# Patient Record
Sex: Female | Born: 1975 | Race: Asian | Hispanic: No | Marital: Married | Smoking: Never smoker
Health system: Southern US, Community
[De-identification: ages and names within clinical notes are randomized; demographics above are authoritative.]

## PROBLEM LIST (undated history)

## (undated) DIAGNOSIS — E785 Hyperlipidemia, unspecified: Secondary | ICD-10-CM

## (undated) DIAGNOSIS — G5600 Carpal tunnel syndrome, unspecified upper limb: Secondary | ICD-10-CM

## (undated) DIAGNOSIS — T7840XA Allergy, unspecified, initial encounter: Secondary | ICD-10-CM

## (undated) HISTORY — DX: Allergy, unspecified, initial encounter: T78.40XA

## (undated) HISTORY — DX: Hyperlipidemia, unspecified: E78.5

## (undated) HISTORY — DX: Carpal tunnel syndrome, unspecified upper limb: G56.00

---

## 2002-05-15 ENCOUNTER — Inpatient Hospital Stay (HOSPITAL_COMMUNITY): Admission: AD | Admit: 2002-05-15 | Discharge: 2002-05-18 | Payer: Self-pay | Admitting: Gynecology

## 2002-06-21 ENCOUNTER — Other Ambulatory Visit: Admission: RE | Admit: 2002-06-21 | Discharge: 2002-06-21 | Payer: Self-pay | Admitting: Gynecology

## 2005-10-28 ENCOUNTER — Other Ambulatory Visit: Admission: RE | Admit: 2005-10-28 | Discharge: 2005-10-28 | Payer: Self-pay | Admitting: *Deleted

## 2013-12-11 ENCOUNTER — Ambulatory Visit: Payer: Self-pay | Admitting: Family Medicine

## 2013-12-16 ENCOUNTER — Ambulatory Visit (INDEPENDENT_AMBULATORY_CARE_PROVIDER_SITE_OTHER): Payer: Managed Care, Other (non HMO) | Admitting: Family Medicine

## 2013-12-16 ENCOUNTER — Encounter: Payer: Self-pay | Admitting: Family Medicine

## 2013-12-16 VITALS — BP 120/86 | HR 91 | Temp 98.2°F | Resp 16 | Ht <= 58 in | Wt 122.1 lb

## 2013-12-16 DIAGNOSIS — Z0000 Encounter for general adult medical examination without abnormal findings: Secondary | ICD-10-CM

## 2013-12-16 LAB — CBC WITH DIFFERENTIAL/PLATELET
Basophils Absolute: 0 10*3/uL (ref 0.0–0.1)
Basophils Relative: 0.4 % (ref 0.0–3.0)
EOS PCT: 1.8 % (ref 0.0–5.0)
Eosinophils Absolute: 0.1 10*3/uL (ref 0.0–0.7)
HEMATOCRIT: 32.9 % — AB (ref 36.0–46.0)
Hemoglobin: 10.4 g/dL — ABNORMAL LOW (ref 12.0–15.0)
LYMPHS ABS: 2.1 10*3/uL (ref 0.7–4.0)
Lymphocytes Relative: 33.7 % (ref 12.0–46.0)
MCHC: 31.7 g/dL (ref 30.0–36.0)
MCV: 78.9 fl (ref 78.0–100.0)
Monocytes Absolute: 0.4 10*3/uL (ref 0.1–1.0)
Monocytes Relative: 5.9 % (ref 3.0–12.0)
NEUTROS ABS: 3.6 10*3/uL (ref 1.4–7.7)
Neutrophils Relative %: 58.2 % (ref 43.0–77.0)
Platelets: 278 10*3/uL (ref 150.0–400.0)
RBC: 4.17 Mil/uL (ref 3.87–5.11)
RDW: 16.9 % — ABNORMAL HIGH (ref 11.5–15.5)
WBC: 6.2 10*3/uL (ref 4.0–10.5)

## 2013-12-16 LAB — LIPID PANEL
CHOL/HDL RATIO: 4
Cholesterol: 248 mg/dL — ABNORMAL HIGH (ref 0–200)
HDL: 61.2 mg/dL (ref >39.00)
LDL Cholesterol: 151 mg/dL — ABNORMAL HIGH (ref 0–99)
NONHDL: 186.8
Triglycerides: 179 mg/dL — ABNORMAL HIGH (ref 0.0–149.0)
VLDL: 35.8 mg/dL (ref 0.0–40.0)

## 2013-12-16 LAB — BASIC METABOLIC PANEL
BUN: 12 mg/dL (ref 6–23)
CALCIUM: 8.4 mg/dL (ref 8.4–10.5)
CHLORIDE: 104 meq/L (ref 96–112)
CO2: 21 mEq/L (ref 19–32)
CREATININE: 0.5 mg/dL (ref 0.4–1.2)
GFR: 134.35 mL/min (ref >60.00)
Glucose, Bld: 92 mg/dL (ref 70–99)
Potassium: 3.4 mEq/L — ABNORMAL LOW (ref 3.5–5.1)
Sodium: 134 mEq/L — ABNORMAL LOW (ref 135–145)

## 2013-12-16 LAB — VITAMIN D 25 HYDROXY (VIT D DEFICIENCY, FRACTURES): VITD: 12.65 ng/mL — ABNORMAL LOW (ref 30.00–100.00)

## 2013-12-16 LAB — HEPATIC FUNCTION PANEL
ALT: 10 U/L (ref 0–35)
AST: 16 U/L (ref 0–37)
Albumin: 4.2 g/dL (ref 3.5–5.2)
Alkaline Phosphatase: 46 U/L (ref 39–117)
BILIRUBIN DIRECT: 0 mg/dL (ref 0.0–0.3)
BILIRUBIN TOTAL: 0.3 mg/dL (ref 0.2–1.2)
Total Protein: 7.8 g/dL (ref 6.0–8.3)

## 2013-12-16 LAB — TSH: TSH: 1.63 u[IU]/mL (ref 0.35–4.50)

## 2013-12-16 NOTE — Progress Notes (Signed)
Pre visit review using our clinic review tool, if applicable. No additional management support is needed unless otherwise documented below in the visit note. 

## 2013-12-16 NOTE — Assessment & Plan Note (Signed)
Pt's PE WNL.  Due for pap- pt prefers to schedule this at a later date.  Due for Tdap- pt doesn't want this today either.  Check labs.  Anticipatory guidance provided.

## 2013-12-16 NOTE — Progress Notes (Signed)
   Subjective:    Patient ID: Gloria Buckley, female    DOB: 07/08/1975, 38 y.o.   MRN: [id number]  HPI New to establish.  No recent MD.  No recent pap.   Review of Systems Patient reports no vision/ hearing changes, adenopathy,fever, weight change,  persistant/recurrent hoarseness , swallowing issues, chest pain, palpitations, edema, persistant/recurrent cough, hemoptysis, dyspnea (rest/exertional/paroxysmal nocturnal), gastrointestinal bleeding (melena, rectal bleeding), abdominal pain, significant heartburn, GU symptoms (dysuria, hematuria, incontinence), Gyn symptoms (abnormal  bleeding, pain),  syncope, focal weakness, memory loss, numbness & tingling, skin/hair/nail changes, abnormal bruising or bleeding, anxiety, or depression.  + chronic constipation     Objective:   Physical Exam General Appearance:    Alert, cooperative, no distress, appears stated age  Head:    Normocephalic, without obvious abnormality, atraumatic  Eyes:    PERRL, conjunctiva/corneas clear, EOM's intact, fundi    benign, both eyes  Ears:    Normal TM's and external ear canals, both ears  Nose:   Nares normal, septum midline, mucosa normal, no drainage    or sinus tenderness  Throat:   Lips, mucosa, and tongue normal; teeth and gums normal  Neck:   Supple, symmetrical, trachea midline, no adenopathy;    Thyroid: no enlargement/tenderness/nodules  Back:     Symmetric, no curvature, ROM normal, no CVA tenderness  Lungs:     Clear to auscultation bilaterally, respirations unlabored  Chest Wall:    No tenderness or deformity   Heart:    Regular rate and rhythm, S1 and S2 normal, no murmur, rub   or gallop  Breast Exam:    Deferred to upcoming exam  Abdomen:     Soft, non-tender, bowel sounds active all four quadrants,    no masses, no organomegaly  Genitalia:    Deferred to upcoming exam  Rectal:    Extremities:   Extremities normal, atraumatic, no cyanosis or edema  Pulses:   2+ and symmetric all extremities   Skin:   Skin color, texture, turgor normal, no rashes or lesions  Lymph nodes:   Cervical, supraclavicular, and axillary nodes normal  Neurologic:   CNII-XII intact, normal strength, sensation and reflexes    throughout          Assessment & Plan:

## 2013-12-16 NOTE — Patient Instructions (Signed)
Schedule your pap at your convenience (15 min appt) We'll notify you of your lab results and make any changes if needed Keep up the good work on healthy diet and try and get regular exercise Add fiber supplement daily to improve constipation Increase your water intake Call with any questions or concerns Welcome!  We're glad to have you!!

## 2013-12-17 ENCOUNTER — Encounter: Payer: Self-pay | Admitting: General Practice

## 2013-12-31 ENCOUNTER — Telehealth: Payer: Self-pay

## 2013-12-31 MED ORDER — VITAMIN D (ERGOCALCIFEROL) 1.25 MG (50000 UNIT) PO CAPS: 50000.0000 [IU] | ORAL_CAPSULE | ORAL | Status: DC

## 2013-12-31 NOTE — Telephone Encounter (Signed)
During nurse visit with patient's daughter, pt stated that she received a letter stating she was to start taking vitamin D 50,000 units and [facility]  Pt stated that prescription was never sent.  The same was verified in the computer.  Prescription sent as ordered by provider- "Due to low Vit D, we need to start 50,000 units weekly x12 weeks and add OTC daily supplement of at least 3000 units."

## 2014-01-27 ENCOUNTER — Ambulatory Visit: Payer: Managed Care, Other (non HMO) | Admitting: Family Medicine

## 2014-02-07 ENCOUNTER — Ambulatory Visit: Payer: Managed Care, Other (non HMO) | Admitting: Family Medicine

## 2014-02-14 ENCOUNTER — Ambulatory Visit: Payer: Managed Care, Other (non HMO) | Admitting: Family Medicine

## 2014-02-21 ENCOUNTER — Other Ambulatory Visit (HOSPITAL_COMMUNITY)
Admission: RE | Admit: 2014-02-21 | Discharge: 2014-02-21 | Disposition: A | Discharge status: Home / Self Care | Payer: Managed Care, Other (non HMO) | Source: Ambulatory Visit | Attending: Family Medicine | Admitting: Family Medicine

## 2014-02-21 ENCOUNTER — Other Ambulatory Visit: Payer: Self-pay | Admitting: General Practice

## 2014-02-21 ENCOUNTER — Encounter: Payer: Self-pay | Admitting: Family Medicine

## 2014-02-21 ENCOUNTER — Ambulatory Visit (INDEPENDENT_AMBULATORY_CARE_PROVIDER_SITE_OTHER): Payer: Managed Care, Other (non HMO) | Admitting: Family Medicine

## 2014-02-21 VITALS — BP 110/70 | HR 90 | Temp 98.2°F | Resp 16 | Wt 123.0 lb

## 2014-02-21 DIAGNOSIS — Z1151 Encounter for screening for human papillomavirus (HPV): Secondary | ICD-10-CM | POA: Diagnosis present

## 2014-02-21 DIAGNOSIS — Z01419 Encounter for gynecological examination (general) (routine) without abnormal findings: Secondary | ICD-10-CM | POA: Insufficient documentation

## 2014-02-21 DIAGNOSIS — Z124 Encounter for screening for malignant neoplasm of cervix: Secondary | ICD-10-CM

## 2014-02-21 MED ORDER — VITAMIN D (ERGOCALCIFEROL) 1.25 MG (50000 UNIT) PO CAPS: 50000.0000 [IU] | ORAL_CAPSULE | ORAL | Status: DC

## 2014-02-21 NOTE — Assessment & Plan Note (Signed)
Pt's breast and pelvic exam are WNL.  Pap collected.

## 2014-02-21 NOTE — Progress Notes (Signed)
Pre visit review using our clinic review tool, if applicable. No additional management support is needed unless otherwise documented below in the visit note. 

## 2014-02-21 NOTE — Progress Notes (Signed)
   Subjective:    Patient ID: Gloria Buckley, female    DOB: 09/11/1975, 39 y.o.   MRN: [id number]  HPI Here today for pap and breast exam.  No concerns.  Denies breast pain, lumps, skin changes, nipple d/c.  No vaginal d/c, pain, concern for STDs, or abnormal bleeding.   Review of Systems For ROS see HPI     Objective:   Physical Exam  Constitutional: She appears well-developed and well-nourished. No distress.  Pulmonary/Chest: Right breast exhibits no inverted nipple, no mass, no nipple discharge, no skin change and no tenderness. Left breast exhibits no inverted nipple, no mass, no nipple discharge, no skin change and no tenderness.  Genitourinary: Rectal exam shows no external hemorrhoid and anal tone normal. There is no rash, tenderness or lesion on the right labia. There is no rash, tenderness or lesion on the left labia. Uterus is not deviated, not enlarged, not fixed and not tender. Cervix exhibits no motion tenderness, no discharge and no friability. Right adnexum displays no mass, no tenderness and no fullness. Left adnexum displays no mass, no tenderness and no fullness. There is bleeding (old menstrual blood present in vault) in the vagina. No erythema or tenderness in the vagina. No foreign body around the vagina. No vaginal discharge found.  Vitals reviewed.         Assessment & Plan:

## 2014-02-21 NOTE — Patient Instructions (Signed)
Follow up to recheck your cholesterol in 6 months We'll notify you of your pap results and make any changes if needed Keep up the good work!  You look great! Call with any questions or concerns Go [place]!

## 2014-02-26 ENCOUNTER — Encounter: Payer: Self-pay | Admitting: General Practice

## 2014-02-26 LAB — CYTOLOGY - PAP

## 2014-03-14 ENCOUNTER — Ambulatory Visit: Payer: Managed Care, Other (non HMO) | Admitting: Medical

## 2014-08-25 ENCOUNTER — Ambulatory Visit: Payer: Managed Care, Other (non HMO) | Admitting: Family Medicine

## 2014-09-16 ENCOUNTER — Encounter: Payer: Self-pay | Admitting: Family Medicine

## 2014-09-16 ENCOUNTER — Encounter: Payer: Self-pay | Admitting: General Practice

## 2014-09-16 ENCOUNTER — Ambulatory Visit (INDEPENDENT_AMBULATORY_CARE_PROVIDER_SITE_OTHER): Payer: Managed Care, Other (non HMO) | Admitting: Family Medicine

## 2014-09-16 VITALS — BP 110/72 | HR 93 | Temp 98.0°F | Resp 16 | Ht <= 58 in | Wt 124.1 lb

## 2014-09-16 DIAGNOSIS — E785 Hyperlipidemia, unspecified: Secondary | ICD-10-CM

## 2014-09-16 LAB — LIPID PANEL
CHOL/HDL RATIO: 3
CHOLESTEROL: 211 mg/dL — AB (ref 0–200)
HDL: 64 mg/dL (ref >39.00)
LDL Cholesterol: 133 mg/dL — ABNORMAL HIGH (ref 0–99)
NonHDL: 146.58
TRIGLYCERIDES: 69 mg/dL (ref 0.0–149.0)
VLDL: 13.8 mg/dL (ref 0.0–40.0)

## 2014-09-16 LAB — BASIC METABOLIC PANEL
BUN: 11 mg/dL (ref 6–23)
CO2: 28 mEq/L (ref 19–32)
CREATININE: 0.5 mg/dL (ref 0.40–1.20)
Calcium: 8.8 mg/dL (ref 8.4–10.5)
Chloride: 104 mEq/L (ref 96–112)
GFR: 146.25 mL/min (ref >60.00)
GLUCOSE: 86 mg/dL (ref 70–99)
POTASSIUM: 3.8 meq/L (ref 3.5–5.1)
Sodium: 138 mEq/L (ref 135–145)

## 2014-09-16 LAB — HEPATIC FUNCTION PANEL
ALBUMIN: 4.1 g/dL (ref 3.5–5.2)
ALK PHOS: 47 U/L (ref 39–117)
ALT: 8 U/L (ref 0–35)
AST: 12 U/L (ref 0–37)
BILIRUBIN DIRECT: 0 mg/dL (ref 0.0–0.3)
TOTAL PROTEIN: 7.7 g/dL (ref 6.0–8.3)
Total Bilirubin: 0.4 mg/dL (ref 0.2–1.2)

## 2014-09-16 NOTE — Progress Notes (Signed)
   Subjective:    Patient ID: Gloria Buckley, female    DOB: 08-24-1975, 39 y.o.   MRN: [id number]  HPI Hyperlipidemia- pt's labs were elevated at last check.  Goal was to work on healthy diet and regular exercise and repeat labs in 6 months.  LDL was 151.  Pt did start some exercise, 'but not a lot'.  No dietary changes.  No CP, SOB, HAs, visual changes, edema, abd pain, N/V.   Review of Systems For ROS see HPI     Objective:   Physical Exam  Constitutional: She is oriented to person, place, and time. She appears well-developed and well-nourished. No distress.  HENT:  Head: Normocephalic and atraumatic.  Eyes: Conjunctivae and EOM are normal. Pupils are equal, round, and reactive to light.  Neck: Normal range of motion. Neck supple. No thyromegaly present.  Cardiovascular: Normal rate, regular rhythm, normal heart sounds and intact distal pulses.   No murmur heard. Pulmonary/Chest: Effort normal and breath sounds normal. No respiratory distress.  Abdominal: Soft. She exhibits no distension. There is no tenderness.  Musculoskeletal: She exhibits no edema.  Lymphadenopathy:    She has no cervical adenopathy.  Neurological: She is alert and oriented to person, place, and time.  Skin: Skin is warm and dry.  Psychiatric: She has a normal mood and affect. Her behavior is normal.  Vitals reviewed.         Assessment & Plan:

## 2014-09-16 NOTE — Assessment & Plan Note (Signed)
Noted on labs done at last visit.  Attempting to control w/ diet and exercise.  Check labs.  Start med prn.

## 2014-09-16 NOTE — Progress Notes (Signed)
Pre visit review using our clinic review tool, if applicable. No additional management support is needed unless otherwise documented below in the visit note. 

## 2014-09-16 NOTE — Patient Instructions (Signed)
Schedule your complete physical in 6 months We'll notify you of your lab results and make any change if needed Keep up the good work!  You look great! We will be at [address], [place], [place] Call with any questions or concerns Happy Labor Day!!!

## 2015-02-19 ENCOUNTER — Telehealth: Payer: Self-pay | Admitting: Family Medicine

## 2015-02-19 NOTE — Telephone Encounter (Signed)
LM for pt to call and schedule flu shot or update records. °

## 2015-03-24 ENCOUNTER — Encounter: Payer: Managed Care, Other (non HMO) | Admitting: Family Medicine

## 2015-04-14 ENCOUNTER — Telehealth: Payer: Self-pay | Admitting: Family Medicine

## 2015-04-14 ENCOUNTER — Encounter: Payer: Managed Care, Other (non HMO) | Admitting: Family Medicine

## 2015-04-15 ENCOUNTER — Encounter: Payer: Self-pay | Admitting: Family Medicine

## 2015-04-15 NOTE — Telephone Encounter (Signed)
Yes- pt needs a no-show fee 

## 2015-04-15 NOTE — Telephone Encounter (Signed)
Marked to charge and mailing no show letter °

## 2015-04-15 NOTE — Telephone Encounter (Signed)
Pt was no show 04/14/15 8:00am for cpe, pt has not rescheduled, 1st no show, multiple cancellations, charge or no charge?

## 2015-06-24 ENCOUNTER — Encounter: Payer: Managed Care, Other (non HMO) | Admitting: Family Medicine

## 2015-10-15 ENCOUNTER — Encounter: Payer: Self-pay | Admitting: Family Medicine

## 2015-10-15 ENCOUNTER — Ambulatory Visit (INDEPENDENT_AMBULATORY_CARE_PROVIDER_SITE_OTHER): Payer: Managed Care, Other (non HMO) | Admitting: Family Medicine

## 2015-10-15 DIAGNOSIS — J309 Allergic rhinitis, unspecified: Secondary | ICD-10-CM | POA: Diagnosis not present

## 2015-10-15 MED ORDER — MONTELUKAST SODIUM 10 MG PO TABS: 10.0000 mg | ORAL_TABLET | Freq: Every day | ORAL | 11 refills | Status: DC

## 2015-10-15 NOTE — Progress Notes (Signed)
Pre visit review using our clinic review tool, if applicable. No additional management support is needed unless otherwise documented below in the visit note. 

## 2015-10-15 NOTE — Progress Notes (Signed)
   Subjective:    Patient ID: Gloria Buckley, female    DOB: 08/17/1975, 40 y.o.   MRN: [id number]  HPI Allergies- pt reports she has had difficulty for over a year.  Taking OTC Zyrtec, Benadryl, Claritin, Nyquil w/ only temporary relief.  sxs are worse at night.  + nasal congestion, sneezing, watery eyes.  Has tried nasal sprays w/ minimal relief.  sxs are year round.   Review of Systems For ROS see HPI     Objective:   Physical Exam  Constitutional: She appears well-developed and well-nourished. No distress.  HENT:  Head: Normocephalic and atraumatic.  Right Ear: Tympanic membrane normal.  Left Ear: Tympanic membrane normal.  Nose: Mucosal edema and rhinorrhea present. Right sinus exhibits no maxillary sinus tenderness and no frontal sinus tenderness. Left sinus exhibits no maxillary sinus tenderness and no frontal sinus tenderness.  Mouth/Throat: Mucous membranes are normal. Posterior oropharyngeal erythema (w/ PND) present.  Eyes: Conjunctivae and EOM are normal. Pupils are equal, round, and reactive to light.  Neck: Normal range of motion. Neck supple.  Cardiovascular: Normal rate, regular rhythm and normal heart sounds.   Pulmonary/Chest: Effort normal and breath sounds normal. No respiratory distress. She has no wheezes. She has no rales.  Lymphadenopathy:    She has no cervical adenopathy.  Vitals reviewed.         Assessment & Plan:

## 2015-10-15 NOTE — Assessment & Plan Note (Signed)
New.  Pt's sxs and PE consistent w/ undertreated seasonal allergies.  Pt report sxs are year round and only temporarily relieved w/ antihistamines and nasal steroids.  Start Singulair daily in addition to [employer] IndustriesFlonase and Zyrtec.  Reviewed supportive care and red flags that should prompt return.  Pt expressed understanding and is in agreement w/ plan.

## 2015-10-15 NOTE — Patient Instructions (Signed)
Schedule your complete physical at your convenience Continue the daily Zyrtec and Flonase (2 sprays each nostril once daily) ADD the Singulair once daily before bed Drink plenty of fluids Call with any questions or concerns Hang in there!!!

## 2016-02-19 ENCOUNTER — Ambulatory Visit (INDEPENDENT_AMBULATORY_CARE_PROVIDER_SITE_OTHER): Payer: Managed Care, Other (non HMO) | Admitting: Family Medicine

## 2016-02-19 ENCOUNTER — Encounter: Payer: Self-pay | Admitting: Family Medicine

## 2016-02-19 VITALS — BP 110/70 | HR 93 | Temp 98.1°F | Resp 16 | Ht <= 58 in | Wt 125.2 lb

## 2016-02-19 DIAGNOSIS — Z1231 Encounter for screening mammogram for malignant neoplasm of breast: Secondary | ICD-10-CM

## 2016-02-19 DIAGNOSIS — Z23 Encounter for immunization: Secondary | ICD-10-CM

## 2016-02-19 DIAGNOSIS — Z0000 Encounter for general adult medical examination without abnormal findings: Secondary | ICD-10-CM | POA: Diagnosis not present

## 2016-02-19 LAB — LIPID PANEL
CHOL/HDL RATIO: 3
CHOLESTEROL: 234 mg/dL — AB (ref 0–200)
HDL: 74.7 mg/dL (ref >39.00)
LDL Cholesterol: 146 mg/dL — ABNORMAL HIGH (ref 0–99)
NonHDL: 158.96
TRIGLYCERIDES: 65 mg/dL (ref 0.0–149.0)
VLDL: 13 mg/dL (ref 0.0–40.0)

## 2016-02-19 LAB — CBC WITH DIFFERENTIAL/PLATELET
Basophils Absolute: 0 10*3/uL (ref 0.0–0.1)
Basophils Relative: 0.6 % (ref 0.0–3.0)
EOS ABS: 0.2 10*3/uL (ref 0.0–0.7)
EOS PCT: 3.9 % (ref 0.0–5.0)
HCT: 32.5 % — ABNORMAL LOW (ref 36.0–46.0)
HEMOGLOBIN: 10.5 g/dL — AB (ref 12.0–15.0)
LYMPHS ABS: 2 10*3/uL (ref 0.7–4.0)
Lymphocytes Relative: 38.2 % (ref 12.0–46.0)
MCHC: 32.1 g/dL (ref 30.0–36.0)
MCV: 80.4 fl (ref 78.0–100.0)
MONO ABS: 0.3 10*3/uL (ref 0.1–1.0)
Monocytes Relative: 6.6 % (ref 3.0–12.0)
Neutro Abs: 2.6 10*3/uL (ref 1.4–7.7)
Neutrophils Relative %: 50.7 % (ref 43.0–77.0)
Platelets: 311 10*3/uL (ref 150.0–400.0)
RBC: 4.05 Mil/uL (ref 3.87–5.11)
RDW: 16.9 % — ABNORMAL HIGH (ref 11.5–15.5)
WBC: 5.1 10*3/uL (ref 4.0–10.5)

## 2016-02-19 LAB — HEPATIC FUNCTION PANEL
ALBUMIN: 4.1 g/dL (ref 3.5–5.2)
ALK PHOS: 46 U/L (ref 39–117)
ALT: 12 U/L (ref 0–35)
AST: 14 U/L (ref 0–37)
Bilirubin, Direct: 0.1 mg/dL (ref 0.0–0.3)
TOTAL PROTEIN: 7.6 g/dL (ref 6.0–8.3)
Total Bilirubin: 0.3 mg/dL (ref 0.2–1.2)

## 2016-02-19 LAB — BASIC METABOLIC PANEL
BUN: 13 mg/dL (ref 6–23)
CHLORIDE: 105 meq/L (ref 96–112)
CO2: 29 meq/L (ref 19–32)
CREATININE: 0.48 mg/dL (ref 0.40–1.20)
Calcium: 8.8 mg/dL (ref 8.4–10.5)
GFR: 152.18 mL/min (ref >60.00)
GLUCOSE: 92 mg/dL (ref 70–99)
POTASSIUM: 4.4 meq/L (ref 3.5–5.1)
Sodium: 138 mEq/L (ref 135–145)

## 2016-02-19 LAB — VITAMIN D 25 HYDROXY (VIT D DEFICIENCY, FRACTURES): VITD: 16.47 ng/mL — AB (ref 30.00–100.00)

## 2016-02-19 LAB — TSH: TSH: 1.29 u[IU]/mL (ref 0.35–4.50)

## 2016-02-19 NOTE — Addendum Note (Signed)
Addended by: Geannie RisenBRODMERKEL, JESSICA L on: 02/19/2016 09:44 AM   Modules accepted: Orders

## 2016-02-19 NOTE — Patient Instructions (Signed)
Follow up in 1 year or as needed We'll notify you of your lab results and make any changes if needed Continue to work on healthy diet and regular exercise- you look great! We'll call you with your mammogram appt Call with any questions or concerns Happy Valentine's Day!!!

## 2016-02-19 NOTE — Progress Notes (Signed)
   Subjective:    Patient ID: Gloria Buckley, female    DOB: 03/21/1975, 41 y.o.   MRN: [id number]  HPI CPE- UTD on pap, due for mammo (now 41).   Review of Systems Patient reports no vision/ hearing changes, adenopathy,fever, weight change,  persistant/recurrent hoarseness , swallowing issues, chest pain, palpitations, edema, persistant/recurrent cough, hemoptysis, dyspnea (rest/exertional/paroxysmal nocturnal), gastrointestinal bleeding (melena, rectal bleeding), abdominal pain, significant heartburn, bowel changes, GU symptoms (dysuria, hematuria, incontinence), Gyn symptoms (abnormal  bleeding, pain),  syncope, focal weakness, memory loss, numbness & tingling, skin/hair/nail changes, abnormal bruising or bleeding, anxiety, or depression.     Objective:   Physical Exam General Appearance:    Alert, cooperative, no distress, appears stated age  Head:    Normocephalic, without obvious abnormality, atraumatic  Eyes:    PERRL, conjunctiva/corneas clear, EOM's intact, fundi    benign, both eyes  Ears:    Normal TM's and external ear canals, both ears  Nose:   Nares normal, septum midline, mucosa normal, no drainage    or sinus tenderness  Throat:   Lips, mucosa, and tongue normal; teeth and gums normal  Neck:   Supple, symmetrical, trachea midline, no adenopathy;    Thyroid: no enlargement/tenderness/nodules  Back:     Symmetric, no curvature, ROM normal, no CVA tenderness  Lungs:     Clear to auscultation bilaterally, respirations unlabored  Chest Wall:    No tenderness or deformity   Heart:    Regular rate and rhythm, S1 and S2 normal, no murmur, rub   or gallop  Breast Exam:    Deferred to mammo  Abdomen:     Soft, non-tender, bowel sounds active all four quadrants,    no masses, no organomegaly  Genitalia:    Deferred  Rectal:    Extremities:   Extremities normal, atraumatic, no cyanosis or edema  Pulses:   2+ and symmetric all extremities  Skin:   Skin color, texture, turgor  normal, no rashes or lesions  Lymph nodes:   Cervical, supraclavicular, and axillary nodes normal  Neurologic:   CNII-XII intact, normal strength, sensation and reflexes    throughout          Assessment & Plan:

## 2016-02-19 NOTE — Progress Notes (Signed)
Pre visit review using our clinic review tool, if applicable. No additional management support is needed unless otherwise documented below in the visit note. 

## 2016-02-19 NOTE — Assessment & Plan Note (Signed)
Pt's PE WNL.  UTD on pap.  Due to start mammo this year- order entered.  Check labs.  Tdap updated.  Anticipatory guidance provided.

## 2016-02-23 ENCOUNTER — Other Ambulatory Visit: Payer: Self-pay | Admitting: General Practice

## 2016-02-23 MED ORDER — FERROUS SULFATE 325 (65 FE) MG PO TABS: 325.0000 mg | ORAL_TABLET | Freq: Every day | ORAL | 6 refills | Status: DC

## 2016-02-23 MED ORDER — VITAMIN D (ERGOCALCIFEROL) 1.25 MG (50000 UNIT) PO CAPS: 50000.0000 [IU] | ORAL_CAPSULE | ORAL | 0 refills | Status: DC

## 2016-09-27 ENCOUNTER — Ambulatory Visit (INDEPENDENT_AMBULATORY_CARE_PROVIDER_SITE_OTHER): Payer: Self-pay | Admitting: Family Medicine

## 2016-09-27 ENCOUNTER — Encounter: Payer: Self-pay | Admitting: Family Medicine

## 2016-09-27 VITALS — BP 106/68 | HR 106 | Temp 98.4°F | Resp 17 | Ht <= 58 in | Wt 129.5 lb

## 2016-09-27 DIAGNOSIS — J301 Allergic rhinitis due to pollen: Secondary | ICD-10-CM

## 2016-09-27 DIAGNOSIS — R490 Dysphonia: Secondary | ICD-10-CM

## 2016-09-27 MED ORDER — MONTELUKAST SODIUM 10 MG PO TABS: 10.0000 mg | ORAL_TABLET | Freq: Every day | ORAL | 11 refills | Status: DC

## 2016-09-27 MED ORDER — FLUTICASONE PROPIONATE 50 MCG/ACT NA SUSP: 2.0000 | Freq: Every day | NASAL | 6 refills | Status: DC

## 2016-09-27 NOTE — Progress Notes (Signed)
   Subjective:    Patient ID: Gloria Buckley, female    DOB: 12/28/1975, 41 y.o.   MRN: [id number]  HPI Hoarseness- pt reports she lost her voice ~1 month ago.  Voice has not returned.  Pt reports her allergies are not severe at this time.  Taking Zyrtec but not currently on Singulair.  Denies GERD.  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   Review of Systems For ROS see HPI     Objective:   Physical Exam  Constitutional: She appears well-developed and well-nourished. No distress.  HENT:  Head: Normocephalic and atraumatic.  Right Ear: Tympanic membrane normal.  Left Ear: Tympanic membrane normal.  Nose: Mucosal edema and rhinorrhea present. Right sinus exhibits no maxillary sinus tenderness and no frontal sinus tenderness. Left sinus exhibits no maxillary sinus tenderness and no frontal sinus tenderness.  Mouth/Throat: Mucous membranes are normal. Posterior oropharyngeal erythema (w/ copious PND) present.  Eyes: Pupils are equal, round, and reactive to light. Conjunctivae and EOM are normal.  Neck: Normal range of motion. Neck supple.  Cardiovascular: Normal rate, regular rhythm and normal heart sounds.   Pulmonary/Chest: Effort normal and breath sounds normal. No respiratory distress. She has no wheezes. She has no rales.  Lymphadenopathy:    She has no cervical adenopathy.  Vitals reviewed.         Assessment & Plan:  Hoarseness- new.  Suspect this is due to pt's copious PND.  Continue daily antihistamine, add nasal steroid, and restart Singulair.  If no improvement in sxs, will refer to ENT for complete evaluation and tx.  Pt expressed understanding and is in agreement w/ plan.

## 2016-09-27 NOTE — Progress Notes (Signed)
Pre visit review using our clinic review tool, if applicable. No additional management support is needed unless otherwise documented below in the visit note. 

## 2016-09-27 NOTE — Assessment & Plan Note (Signed)
Deteriorated.  Pt is no longer taking her Singulair.  Restart.  Add Flonase.  This is most likely the cause of her hoarseness given the copious drainage.  Reviewed supportive care and red flags that should prompt return.  Pt expressed understanding and is in agreement w/ plan.

## 2016-09-27 NOTE — Patient Instructions (Signed)
Follow up as needed or as scheduled Start the Montelukast (Singulair) nightly for the allergy drainage Continue the daily allergy pill you have in your purse Add the nasal spray- 2 sprays each nostril Drink plenty of fluids Try and rest your voice If no improvement in the hoarseness in 2 weeks- please let me know so we can send you to the ENT for a complete evaluation Call with any questions or concerns Hang in there!

## 2016-10-16 ENCOUNTER — Encounter: Payer: Self-pay | Admitting: Family Medicine

## 2016-10-17 ENCOUNTER — Other Ambulatory Visit: Payer: Self-pay | Admitting: Family Medicine

## 2016-10-17 DIAGNOSIS — J029 Acute pharyngitis, unspecified: Secondary | ICD-10-CM

## 2016-10-17 DIAGNOSIS — R490 Dysphonia: Secondary | ICD-10-CM

## 2016-12-01 ENCOUNTER — Ambulatory Visit: Payer: Self-pay | Admitting: *Deleted

## 2016-12-01 NOTE — Telephone Encounter (Signed)
Abd pain for 2 days, no vomiting. States thinks it is from food poisoning. Diarrhea today. Will be checked out at Urgent Care.  Care advice given to patient with verbal understanding.  Reason for Disposition . [1] MODERATE pain (e.g., interferes with normal activities) AND [2] pain comes and goes (cramps) AND [3] present > 24 hours  (Exception: pain with Vomiting or Diarrhea - see that Guideline)  Answer Assessment - Initial Assessment Questions 1. LOCATION: "Where does it hurt?"      Entire abd 2. RADIATION: "Does the pain shoot anywhere else?" (e.g., chest, back)     Shoots everywhere 3. ONSET: "When did the pain begin?" (e.g., minutes, hours or days ago)      2 days ago 4. SUDDEN: "Gradual or sudden onset?"     sudden 5. PATTERN "Does the pain come and go, or is it constant?"    - If constant: "Is it getting better, staying the same, or worsening?"      (Note: Constant means the pain never goes away completely; most serious pain is constant and it progresses)     - If intermittent: "How long does it last?" "Do you have pain now?"     (Note: Intermittent means the pain goes away completely between bouts)     Comes and goes with eating 6. SEVERITY: "How bad is the pain?"  (e.g., Scale 1-10; mild, moderate, or severe)   - MILD (1-3): doesn't interfere with normal activities, abdomen soft and not tender to touch    - MODERATE (4-7): interferes with normal activities or awakens from sleep, tender to touch    - SEVERE (8-10): excruciating pain, doubled over, unable to do any normal activities      10 last night, now 7 7. RECURRENT SYMPTOM: "Have you ever had this type of abdominal pain before?" If so, ask: "When was the last time?" and "What happened that time?"      no 8. CAUSE: "What do you think is causing the abdominal pain?"     Food poisioning 9. RELIEVING/AGGRAVATING FACTORS: "What makes it better or worse?" (e.g., movement, antacids, bowel movement)     Eating makes it  worse 10. OTHER SYMPTOMS: "Has there been any vomiting, diarrhea, constipation, or urine problems?"       Diarrhea this morning 11. PREGNANCY: "Is there any chance you are pregnant?" "When was your last menstrual period?"       No LMP on now  Protocols used: [facility] - [facility]

## 2016-12-01 NOTE — Telephone Encounter (Signed)
Just FYI -   Triage sent patient to urgent care.

## 2016-12-05 ENCOUNTER — Ambulatory Visit: Payer: Managed Care, Other (non HMO) | Admitting: Family Medicine

## 2017-02-20 ENCOUNTER — Encounter: Payer: Managed Care, Other (non HMO) | Admitting: Family Medicine

## 2017-07-19 ENCOUNTER — Encounter: Payer: Self-pay | Admitting: Family Medicine

## 2017-07-19 ENCOUNTER — Other Ambulatory Visit: Payer: Self-pay

## 2017-07-19 ENCOUNTER — Ambulatory Visit (INDEPENDENT_AMBULATORY_CARE_PROVIDER_SITE_OTHER): Payer: Managed Care, Other (non HMO) | Admitting: Family Medicine

## 2017-07-19 ENCOUNTER — Other Ambulatory Visit (HOSPITAL_COMMUNITY)
Admission: RE | Admit: 2017-07-19 | Discharge: 2017-07-19 | Disposition: A | Discharge status: Home / Self Care | Payer: Managed Care, Other (non HMO) | Source: Ambulatory Visit | Attending: Family Medicine | Admitting: Family Medicine

## 2017-07-19 VITALS — BP 100/70 | HR 78 | Temp 97.8°F | Resp 16 | Ht 58.5 in | Wt 128.6 lb

## 2017-07-19 DIAGNOSIS — Z1231 Encounter for screening mammogram for malignant neoplasm of breast: Secondary | ICD-10-CM

## 2017-07-19 DIAGNOSIS — Z124 Encounter for screening for malignant neoplasm of cervix: Secondary | ICD-10-CM | POA: Insufficient documentation

## 2017-07-19 DIAGNOSIS — E663 Overweight: Secondary | ICD-10-CM

## 2017-07-19 DIAGNOSIS — E559 Vitamin D deficiency, unspecified: Secondary | ICD-10-CM

## 2017-07-19 DIAGNOSIS — Z0000 Encounter for general adult medical examination without abnormal findings: Secondary | ICD-10-CM

## 2017-07-19 LAB — CBC WITH DIFFERENTIAL/PLATELET
BASOS PCT: 0.8 % (ref 0.0–3.0)
Basophils Absolute: 0 10*3/uL (ref 0.0–0.1)
EOS PCT: 3.2 % (ref 0.0–5.0)
Eosinophils Absolute: 0.2 10*3/uL (ref 0.0–0.7)
HCT: 32.4 % — ABNORMAL LOW (ref 36.0–46.0)
Hemoglobin: 10.5 g/dL — ABNORMAL LOW (ref 12.0–15.0)
LYMPHS ABS: 1.6 10*3/uL (ref 0.7–4.0)
Lymphocytes Relative: 33 % (ref 12.0–46.0)
MCHC: 32.4 g/dL (ref 30.0–36.0)
MCV: 81.8 fl (ref 78.0–100.0)
MONO ABS: 0.4 10*3/uL (ref 0.1–1.0)
Monocytes Relative: 7.2 % (ref 3.0–12.0)
NEUTROS ABS: 2.8 10*3/uL (ref 1.4–7.7)
NEUTROS PCT: 55.8 % (ref 43.0–77.0)
Platelets: 260 10*3/uL (ref 150.0–400.0)
RBC: 3.96 Mil/uL (ref 3.87–5.11)
RDW: 15 % (ref 11.5–15.5)
WBC: 5 10*3/uL (ref 4.0–10.5)

## 2017-07-19 LAB — LIPID PANEL
CHOL/HDL RATIO: 3
Cholesterol: 231 mg/dL — ABNORMAL HIGH (ref 0–200)
HDL: 73 mg/dL (ref >39.00)
LDL CALC: 142 mg/dL — AB (ref 0–99)
NonHDL: 158.46
TRIGLYCERIDES: 83 mg/dL (ref 0.0–149.0)
VLDL: 16.6 mg/dL (ref 0.0–40.0)

## 2017-07-19 LAB — BASIC METABOLIC PANEL
BUN: 7 mg/dL (ref 6–23)
CALCIUM: 8.7 mg/dL (ref 8.4–10.5)
CO2: 28 mEq/L (ref 19–32)
CREATININE: 0.48 mg/dL (ref 0.40–1.20)
Chloride: 106 mEq/L (ref 96–112)
GFR: 151.11 mL/min (ref >60.00)
Glucose, Bld: 97 mg/dL (ref 70–99)
Potassium: 4 mEq/L (ref 3.5–5.1)
Sodium: 139 mEq/L (ref 135–145)

## 2017-07-19 LAB — HEPATIC FUNCTION PANEL
ALK PHOS: 38 U/L — AB (ref 39–117)
ALT: 8 U/L (ref 0–35)
AST: 12 U/L (ref 0–37)
Albumin: 4.1 g/dL (ref 3.5–5.2)
BILIRUBIN TOTAL: 0.4 mg/dL (ref 0.2–1.2)
Bilirubin, Direct: 0.1 mg/dL (ref 0.0–0.3)
Total Protein: 7 g/dL (ref 6.0–8.3)

## 2017-07-19 LAB — TSH: TSH: 2.19 u[IU]/mL (ref 0.35–4.50)

## 2017-07-19 LAB — VITAMIN D 25 HYDROXY (VIT D DEFICIENCY, FRACTURES): VITD: 17.14 ng/mL — ABNORMAL LOW (ref 30.00–100.00)

## 2017-07-19 MED ORDER — CETIRIZINE HCL 10 MG PO TABS: 10.0000 mg | ORAL_TABLET | Freq: Every day | ORAL | 6 refills | Status: DC

## 2017-07-19 MED ORDER — MONTELUKAST SODIUM 10 MG PO TABS: 10.0000 mg | ORAL_TABLET | Freq: Every day | ORAL | 11 refills | Status: DC

## 2017-07-19 MED ORDER — FLUTICASONE PROPIONATE 50 MCG/ACT NA SUSP: 2.0000 | Freq: Every day | NASAL | 6 refills | Status: DC

## 2017-07-19 NOTE — Assessment & Plan Note (Signed)
Pt's PE WNL.  UTD on Tdap, due for mammo.  Pap collected.  Check labs.  Anticipatory guidance provided.

## 2017-07-19 NOTE — Patient Instructions (Signed)
Schedule a nurse visit at your convenience for your Hep A shot Follow up with me in 1 year or as needed We'll notify you of your lab results and make any changes if needed Keep up the good work on healthy diet and regular exercise Restart the Singulair, Zyrtec, and Flonase to help w/ allergies (prescriptions sent!) Call with any questions or concerns Have a great summer!!

## 2017-07-19 NOTE — Assessment & Plan Note (Signed)
Pap collected. 

## 2017-07-19 NOTE — Progress Notes (Signed)
   Subjective:    Patient ID: Gloria Buckley, female    DOB: 01/06/1976, 42 y.o.   MRN: [id number]  HPI CPE- due for mammo, pap.  UTD on Tdap.   Review of Systems Patient reports no vision/ hearing changes, adenopathy,fever, weight change,  persistant/recurrent hoarseness , swallowing issues, chest pain, palpitations, edema, persistant/recurrent cough, hemoptysis, dyspnea (rest/exertional/paroxysmal nocturnal), gastrointestinal bleeding (melena, rectal bleeding), abdominal pain, significant heartburn, bowel changes, GU symptoms (dysuria, hematuria, incontinence), Gyn symptoms (abnormal  bleeding, pain),  syncope, focal weakness, memory loss, numbness & tingling, skin/hair/nail changes, abnormal bruising or bleeding, anxiety, or depression.     Objective:   Physical Exam  General Appearance:    Alert, cooperative, no distress, appears stated age  Head:    Normocephalic, without obvious abnormality, atraumatic  Eyes:    PERRL, conjunctiva/corneas clear, EOM's intact, fundi    benign, both eyes  Ears:    Normal TM's and external ear canals, both ears  Nose:   Nares normal, septum midline, mucosa normal, no drainage    or sinus tenderness  Throat:   Lips, mucosa, and tongue normal; teeth and gums normal  Neck:   Supple, symmetrical, trachea midline, no adenopathy;    Thyroid: no enlargement/tenderness/nodules  Back:     Symmetric, no curvature, ROM normal, no CVA tenderness  Lungs:     Clear to auscultation bilaterally, respirations unlabored  Chest Wall:    No tenderness or deformity   Heart:    Regular rate and rhythm, S1 and S2 normal, no murmur, rub   or gallop  Breast Exam:    No tenderness, masses, or nipple abnormality  Abdomen:     Soft, non-tender, bowel sounds active all four quadrants,    no masses, no organomegaly  Genitalia:    External genitalia normal, cervix normal in appearance, no CMT, uterus in normal size and position, adnexa w/out mass or tenderness, mucosa pink and  moist, no lesions or discharge present  Rectal:    Normal external appearance  Extremities:   Extremities normal, atraumatic, no cyanosis or edema  Pulses:   2+ and symmetric all extremities  Skin:   Skin color, texture, turgor normal, no rashes or lesions  Lymph nodes:   Cervical, supraclavicular, and axillary nodes normal  Neurologic:   CNII-XII intact, normal strength, sensation and reflexes    throughout          Assessment & Plan:

## 2017-07-21 ENCOUNTER — Other Ambulatory Visit: Payer: Self-pay

## 2017-07-21 MED ORDER — VITAMIN D (ERGOCALCIFEROL) 1.25 MG (50000 UNIT) PO CAPS: 50000.0000 [IU] | ORAL_CAPSULE | ORAL | 0 refills | Status: DC

## 2017-07-24 LAB — CYTOLOGY - PAP
Diagnosis: NEGATIVE
HPV: NOT DETECTED

## 2017-09-13 ENCOUNTER — Ambulatory Visit: Payer: Managed Care, Other (non HMO)

## 2017-09-13 ENCOUNTER — Ambulatory Visit (INDEPENDENT_AMBULATORY_CARE_PROVIDER_SITE_OTHER): Payer: Managed Care, Other (non HMO)

## 2017-09-13 DIAGNOSIS — Z23 Encounter for immunization: Secondary | ICD-10-CM | POA: Diagnosis not present

## 2017-09-13 NOTE — Progress Notes (Signed)
Patient received Hep A vaccination in her left deltoid today and tolerated well.

## 2017-09-20 ENCOUNTER — Ambulatory Visit: Payer: Managed Care, Other (non HMO)

## 2017-10-09 ENCOUNTER — Ambulatory Visit
Admission: RE | Admit: 2017-10-09 | Discharge: 2017-10-09 | Disposition: A | Discharge status: Home / Self Care | Payer: Managed Care, Other (non HMO) | Source: Ambulatory Visit | Attending: Family Medicine | Admitting: Family Medicine

## 2017-10-09 DIAGNOSIS — Z1231 Encounter for screening mammogram for malignant neoplasm of breast: Secondary | ICD-10-CM

## 2018-07-23 ENCOUNTER — Encounter: Payer: Self-pay | Admitting: Family Medicine

## 2018-07-23 ENCOUNTER — Ambulatory Visit (INDEPENDENT_AMBULATORY_CARE_PROVIDER_SITE_OTHER): Payer: Managed Care, Other (non HMO) | Admitting: Family Medicine

## 2018-07-23 ENCOUNTER — Other Ambulatory Visit: Payer: Self-pay

## 2018-07-23 VITALS — BP 121/81 | HR 83 | Temp 98.0°F | Resp 16 | Ht 59.0 in | Wt 130.2 lb

## 2018-07-23 DIAGNOSIS — E559 Vitamin D deficiency, unspecified: Secondary | ICD-10-CM | POA: Insufficient documentation

## 2018-07-23 DIAGNOSIS — E785 Hyperlipidemia, unspecified: Secondary | ICD-10-CM | POA: Diagnosis not present

## 2018-07-23 DIAGNOSIS — Z0000 Encounter for general adult medical examination without abnormal findings: Secondary | ICD-10-CM

## 2018-07-23 LAB — BASIC METABOLIC PANEL
BUN: 11 mg/dL (ref 6–23)
CO2: 25 mEq/L (ref 19–32)
Calcium: 8.7 mg/dL (ref 8.4–10.5)
Chloride: 105 mEq/L (ref 96–112)
Creatinine, Ser: 0.54 mg/dL (ref 0.40–1.20)
GFR: 123.5 mL/min (ref >60.00)
Glucose, Bld: 92 mg/dL (ref 70–99)
Potassium: 3.9 mEq/L (ref 3.5–5.1)
Sodium: 137 mEq/L (ref 135–145)

## 2018-07-23 LAB — CBC WITH DIFFERENTIAL/PLATELET
Basophils Absolute: 0.1 10*3/uL (ref 0.0–0.1)
Basophils Relative: 1 % (ref 0.0–3.0)
Eosinophils Absolute: 0.2 10*3/uL (ref 0.0–0.7)
Eosinophils Relative: 3.8 % (ref 0.0–5.0)
HCT: 36.9 % (ref 36.0–46.0)
Hemoglobin: 12 g/dL (ref 12.0–15.0)
Lymphocytes Relative: 27.7 % (ref 12.0–46.0)
Lymphs Abs: 1.5 10*3/uL (ref 0.7–4.0)
MCHC: 32.5 g/dL (ref 30.0–36.0)
MCV: 87.9 fl (ref 78.0–100.0)
Monocytes Absolute: 0.4 10*3/uL (ref 0.1–1.0)
Monocytes Relative: 7 % (ref 3.0–12.0)
Neutro Abs: 3.4 10*3/uL (ref 1.4–7.7)
Neutrophils Relative %: 60.5 % (ref 43.0–77.0)
Platelets: 254 10*3/uL (ref 150.0–400.0)
RBC: 4.2 Mil/uL (ref 3.87–5.11)
RDW: 14.7 % (ref 11.5–15.5)
WBC: 5.6 10*3/uL (ref 4.0–10.5)

## 2018-07-23 LAB — HEPATIC FUNCTION PANEL
ALT: 11 U/L (ref 0–35)
AST: 14 U/L (ref 0–37)
Albumin: 4.2 g/dL (ref 3.5–5.2)
Alkaline Phosphatase: 44 U/L (ref 39–117)
Bilirubin, Direct: 0.1 mg/dL (ref 0.0–0.3)
Total Bilirubin: 0.5 mg/dL (ref 0.2–1.2)
Total Protein: 7.4 g/dL (ref 6.0–8.3)

## 2018-07-23 LAB — LIPID PANEL
Cholesterol: 220 mg/dL — ABNORMAL HIGH (ref 0–200)
HDL: 67.6 mg/dL (ref >39.00)
LDL Cholesterol: 133 mg/dL — ABNORMAL HIGH (ref 0–99)
NonHDL: 152.24
Total CHOL/HDL Ratio: 3
Triglycerides: 97 mg/dL (ref 0.0–149.0)
VLDL: 19.4 mg/dL (ref 0.0–40.0)

## 2018-07-23 LAB — VITAMIN D 25 HYDROXY (VIT D DEFICIENCY, FRACTURES): VITD: 12.75 ng/mL — ABNORMAL LOW (ref 30.00–100.00)

## 2018-07-23 LAB — TSH: TSH: 1.84 u[IU]/mL (ref 0.35–4.50)

## 2018-07-23 NOTE — Assessment & Plan Note (Signed)
Pt has hx of this.  Check labs and replete prn. 

## 2018-07-23 NOTE — Assessment & Plan Note (Signed)
Chronic problem.  Attempting to control w/ diet and exercise.  Check labs.  Start meds prn. 

## 2018-07-23 NOTE — Assessment & Plan Note (Signed)
Pt's PE WNL w/ exception of being overweight.  UTD on pap, mammo.  UTD on immunizations.  Check labs.  Anticipatory guidance provided.

## 2018-07-23 NOTE — Patient Instructions (Signed)
Follow up in 1 year or as needed We'll notify you of your lab results and make any changes if needed Continue to work on healthy diet and regular exercise- you can do it! Call with any questions or concerns Have a great summer! Stay Safe!

## 2018-07-23 NOTE — Progress Notes (Signed)
   Subjective:    Patient ID: Gloria Buckley, female    DOB: [birth year], 43 y.o.   MRN: [id number]  HPI CPE- UTD on pap, mammo, Tdap.   Review of Systems Patient reports no vision/ hearing changes, adenopathy,fever, weight change,  persistant/recurrent hoarseness , swallowing issues, chest pain, palpitations, edema, persistant/recurrent cough, hemoptysis, dyspnea (rest/exertional/paroxysmal nocturnal), gastrointestinal bleeding (melena, rectal bleeding), abdominal pain, significant heartburn, bowel changes, GU symptoms (dysuria, hematuria, incontinence), Gyn symptoms (abnormal  bleeding, pain),  syncope, focal weakness, memory loss, numbness & tingling, skin/hair/nail changes, abnormal bruising or bleeding, anxiety, or depression.     Objective:   Physical Exam General Appearance:    Alert, cooperative, no distress, appears stated age  Head:    Normocephalic, without obvious abnormality, atraumatic  Eyes:    PERRL, conjunctiva/corneas clear, EOM's intact, fundi    benign, both eyes  Ears:    Normal TM's and external ear canals, both ears  Nose:   Nares normal, septum midline, mucosa normal, no drainage    or sinus tenderness  Throat:   Lips, mucosa, and tongue normal; teeth and gums normal  Neck:   Supple, symmetrical, trachea midline, no adenopathy;    Thyroid: no enlargement/tenderness/nodules  Back:     Symmetric, no curvature, ROM normal, no CVA tenderness  Lungs:     Clear to auscultation bilaterally, respirations unlabored  Chest Wall:    No tenderness or deformity   Heart:    Regular rate and rhythm, S1 and S2 normal, no murmur, rub   or gallop  Breast Exam:    Deferred to GYN  Abdomen:     Soft, non-tender, bowel sounds active all four quadrants,    no masses, no organomegaly  Genitalia:    Deferred to GYN  Rectal:    Extremities:   Extremities normal, atraumatic, no cyanosis or edema  Pulses:   2+ and symmetric all extremities  Skin:   Skin color, texture, turgor normal, no  rashes or lesions  Lymph nodes:   Cervical, supraclavicular, and axillary nodes normal  Neurologic:   CNII-XII intact, normal strength, sensation and reflexes    throughout          Assessment & Plan:

## 2018-07-24 ENCOUNTER — Other Ambulatory Visit: Payer: Self-pay | Admitting: General Practice

## 2018-07-24 MED ORDER — VITAMIN D (ERGOCALCIFEROL) 1.25 MG (50000 UNIT) PO CAPS: 50000.0000 [IU] | ORAL_CAPSULE | ORAL | 0 refills | Status: DC

## 2018-07-27 ENCOUNTER — Other Ambulatory Visit: Payer: Self-pay | Admitting: Family Medicine

## 2019-05-02 ENCOUNTER — Ambulatory Visit (INDEPENDENT_AMBULATORY_CARE_PROVIDER_SITE_OTHER): Payer: Managed Care, Other (non HMO) | Admitting: Family Medicine

## 2019-05-02 ENCOUNTER — Other Ambulatory Visit: Payer: Self-pay

## 2019-05-02 ENCOUNTER — Encounter: Payer: Self-pay | Admitting: Family Medicine

## 2019-05-02 VITALS — BP 120/74 | HR 94 | Temp 97.9°F | Resp 16 | Ht 59.0 in | Wt 136.2 lb

## 2019-05-02 DIAGNOSIS — J301 Allergic rhinitis due to pollen: Secondary | ICD-10-CM | POA: Diagnosis not present

## 2019-05-02 DIAGNOSIS — G5601 Carpal tunnel syndrome, right upper limb: Secondary | ICD-10-CM

## 2019-05-02 MED ORDER — MELOXICAM 15 MG PO TABS: 15.0000 mg | ORAL_TABLET | Freq: Every day | ORAL | 0 refills | Status: DC

## 2019-05-02 MED ORDER — CETIRIZINE HCL 10 MG PO TABS: 10.0000 mg | ORAL_TABLET | Freq: Every day | ORAL | 6 refills | Status: DC

## 2019-05-02 MED ORDER — FLUTICASONE PROPIONATE 50 MCG/ACT NA SUSP: NASAL | 6 refills | Status: DC

## 2019-05-02 MED ORDER — MONTELUKAST SODIUM 10 MG PO TABS: 10.0000 mg | ORAL_TABLET | Freq: Every day | ORAL | 11 refills | Status: DC

## 2019-05-02 NOTE — Progress Notes (Signed)
   Subjective:    Patient ID: Gloria Buckley, female    DOB: Jun 08, 1975, 44 y.o.   MRN: [id number]  HPI R hand numbness- pt reports R hand will go numb while sleeping and when she wakes up.  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  R hand dominant.  First 3 fingers will be the most numb.  Has been working quite a bit recently.  sxs started ~3 weeks ago.  Notes some forearm swelling.  Seasonal allergies- needs refills on Zyrtec, Singulair, and Flonase   Review of Systems For ROS see HPI   This visit occurred during the SARS-CoV-2 public health emergency.  Safety protocols were in place, including screening questions prior to the visit, additional usage of staff PPE, and extensive cleaning of exam room while observing appropriate contact time as indicated for disinfecting solutions.       Objective:   Physical Exam Vitals reviewed.  Constitutional:      General: She is not in acute distress.    Appearance: Normal appearance. She is not ill-appearing.  HENT:     Head: Normocephalic and atraumatic.  Cardiovascular:     Pulses: Normal pulses.  Skin:    General: Skin is warm and dry.  Neurological:     General: No focal deficit present.     Mental Status: She is alert and oriented to person, place, and time.     Cranial Nerves: No cranial nerve deficit.     Sensory: No sensory deficit.     Motor: No weakness.     Coordination: Coordination normal.     Deep Tendon Reflexes: Reflexes normal.     Comments: Mild numbness w/ phalen's on R           Assessment & Plan:  R carpal tunnel- new.  pt's sxs are consistent w/ dx.  Reviewed dx and tx options w/ pt.  She is not able to wear wrist brace while working but she can wear it at night and while at home.  Encouraged her to start once daily Meloxicam for inflammation and to ice in between customers at work and when done for the day.  If no improvement will need  referral to hand specialist.  Pt expressed understanding and is in agreement w/ plan.   Allergic Rhinitis- refills provided for pt upon request

## 2019-05-02 NOTE — Patient Instructions (Signed)
Follow up as needed or as scheduled WEAR the wrist brace while at home and at night to help w/ pain/inflammation/numbness ICE when you are on break at work or when you are done for the day TAKE the Meloxicam 1 tab daily w/ food If no improvement or worsening, please let me know Call with any questions or concerns Hang in there!!!

## 2019-05-30 ENCOUNTER — Other Ambulatory Visit: Payer: Self-pay | Admitting: General Practice

## 2019-05-30 MED ORDER — MELOXICAM 15 MG PO TABS: 15.0000 mg | ORAL_TABLET | Freq: Every day | ORAL | 0 refills | Status: DC

## 2019-07-25 ENCOUNTER — Encounter: Payer: Managed Care, Other (non HMO) | Admitting: Family Medicine

## 2019-08-06 ENCOUNTER — Ambulatory Visit (INDEPENDENT_AMBULATORY_CARE_PROVIDER_SITE_OTHER): Payer: Managed Care, Other (non HMO) | Admitting: Family Medicine

## 2019-08-06 ENCOUNTER — Other Ambulatory Visit: Payer: Self-pay

## 2019-08-06 ENCOUNTER — Encounter: Payer: Self-pay | Admitting: Family Medicine

## 2019-08-06 VITALS — BP 110/78 | HR 69 | Temp 97.8°F | Resp 16 | Ht 59.0 in | Wt 130.5 lb

## 2019-08-06 DIAGNOSIS — Z0000 Encounter for general adult medical examination without abnormal findings: Secondary | ICD-10-CM

## 2019-08-06 DIAGNOSIS — Z1231 Encounter for screening mammogram for malignant neoplasm of breast: Secondary | ICD-10-CM

## 2019-08-06 DIAGNOSIS — E559 Vitamin D deficiency, unspecified: Secondary | ICD-10-CM

## 2019-08-06 DIAGNOSIS — N912 Amenorrhea, unspecified: Secondary | ICD-10-CM

## 2019-08-06 DIAGNOSIS — E785 Hyperlipidemia, unspecified: Secondary | ICD-10-CM

## 2019-08-06 LAB — BASIC METABOLIC PANEL
BUN: 8 mg/dL (ref 6–23)
CO2: 27 mEq/L (ref 19–32)
Calcium: 9.2 mg/dL (ref 8.4–10.5)
Chloride: 102 mEq/L (ref 96–112)
Creatinine, Ser: 0.53 mg/dL (ref 0.40–1.20)
GFR: 125.58 mL/min (ref >60.00)
Glucose, Bld: 93 mg/dL (ref 70–99)
Potassium: 3.8 mEq/L (ref 3.5–5.1)
Sodium: 137 mEq/L (ref 135–145)

## 2019-08-06 LAB — CBC WITH DIFFERENTIAL/PLATELET
Basophils Absolute: 0 10*3/uL (ref 0.0–0.1)
Basophils Relative: 0.6 % (ref 0.0–3.0)
Eosinophils Absolute: 0.1 10*3/uL (ref 0.0–0.7)
Eosinophils Relative: 1.4 % (ref 0.0–5.0)
HCT: 37.7 % (ref 36.0–46.0)
Hemoglobin: 12.6 g/dL (ref 12.0–15.0)
Lymphocytes Relative: 29.6 % (ref 12.0–46.0)
Lymphs Abs: 1.8 10*3/uL (ref 0.7–4.0)
MCHC: 33.4 g/dL (ref 30.0–36.0)
MCV: 86.3 fl (ref 78.0–100.0)
Monocytes Absolute: 0.3 10*3/uL (ref 0.1–1.0)
Monocytes Relative: 5.5 % (ref 3.0–12.0)
Neutro Abs: 3.8 10*3/uL (ref 1.4–7.7)
Neutrophils Relative %: 62.9 % (ref 43.0–77.0)
Platelets: 255 10*3/uL (ref 150.0–400.0)
RBC: 4.37 Mil/uL (ref 3.87–5.11)
RDW: 13.7 % (ref 11.5–15.5)
WBC: 6 10*3/uL (ref 4.0–10.5)

## 2019-08-06 LAB — LIPID PANEL
Cholesterol: 262 mg/dL — ABNORMAL HIGH (ref 0–200)
HDL: 69 mg/dL (ref >39.00)
LDL Cholesterol: 168 mg/dL — ABNORMAL HIGH (ref 0–99)
NonHDL: 193.34
Total CHOL/HDL Ratio: 4
Triglycerides: 127 mg/dL (ref 0.0–149.0)
VLDL: 25.4 mg/dL (ref 0.0–40.0)

## 2019-08-06 LAB — FOLLICLE STIMULATING HORMONE: FSH: 35 m[IU]/mL

## 2019-08-06 LAB — HEPATIC FUNCTION PANEL
ALT: 11 U/L (ref 0–35)
AST: 12 U/L (ref 0–37)
Albumin: 4.2 g/dL (ref 3.5–5.2)
Alkaline Phosphatase: 51 U/L (ref 39–117)
Bilirubin, Direct: 0.1 mg/dL (ref 0.0–0.3)
Total Bilirubin: 0.6 mg/dL (ref 0.2–1.2)
Total Protein: 7.6 g/dL (ref 6.0–8.3)

## 2019-08-06 LAB — LUTEINIZING HORMONE: LH: 59.87 m[IU]/mL

## 2019-08-06 LAB — TSH: TSH: 1.65 u[IU]/mL (ref 0.35–4.50)

## 2019-08-06 LAB — VITAMIN D 25 HYDROXY (VIT D DEFICIENCY, FRACTURES): VITD: 13.79 ng/mL — ABNORMAL LOW (ref 30.00–100.00)

## 2019-08-06 NOTE — Progress Notes (Signed)
   Subjective:    Patient ID: Gloria Buckley, female    DOB: 08-23-1975, 44 y.o.   MRN: [id number]  HPI CPE- UTD on pap (due next year), due for mammogram.  UTD on Tdap, COVID vaccines.  Pt is down 5 lbs since last visit.  Reviewed past medical, surgical, family and social histories.   Review of Systems Patient reports no vision/ hearing changes, adenopathy,fever, persistant/recurrent hoarseness , swallowing issues, chest pain, palpitations, edema, persistant/recurrent cough, hemoptysis, dyspnea (rest/exertional/paroxysmal nocturnal), gastrointestinal bleeding (melena, rectal bleeding), abdominal pain, significant heartburn, bowel changes, GU symptoms (dysuria, hematuria, incontinence),  syncope, focal weakness, memory loss, numbness & tingling, skin/hair/nail changes, abnormal bruising or bleeding, anxiety, or depression.   Pt has not had period is ~5-6 months.  Mom was menopausal at age 16.  No chance of pregnancy.  This visit occurred during the SARS-CoV-2 public health emergency.  Safety protocols were in place, including screening questions prior to the visit, additional usage of staff PPE, and extensive cleaning of exam room while observing appropriate contact time as indicated for disinfecting solutions.       Objective:   Physical Exam General Appearance:    Alert, cooperative, no distress, appears stated age  Head:    Normocephalic, without obvious abnormality, atraumatic  Eyes:    PERRL, conjunctiva/corneas clear, EOM's intact, fundi    benign, both eyes  Ears:    Normal TM's and external ear canals, both ears  Nose:   Deferred due to COVID  Throat:   Neck:   Supple, symmetrical, trachea midline, no adenopathy;    Thyroid: no enlargement/tenderness/nodules  Back:     Symmetric, no curvature, ROM normal, no CVA tenderness  Lungs:     Clear to auscultation bilaterally, respirations unlabored  Chest Wall:    No tenderness or deformity   Heart:    Regular rate and rhythm, S1 and S2  normal, no murmur, rub   or gallop  Breast Exam:    Deferred to mammo  Abdomen:     Soft, non-tender, bowel sounds active all four quadrants,    no masses, no organomegaly  Genitalia:    Deferred  Rectal:    Extremities:   Extremities normal, atraumatic, no cyanosis or edema  Pulses:   2+ and symmetric all extremities  Skin:   Skin color, texture, turgor normal, no rashes or lesions  Lymph nodes:   Cervical, supraclavicular, and axillary nodes normal  Neurologic:   CNII-XII intact, normal strength, sensation and reflexes    throughout          Assessment & Plan:

## 2019-08-06 NOTE — Assessment & Plan Note (Signed)
Chronic problem.  Attempting to control w/ diet and exercise.  Pt is down 5 lbs since last visit.  Check labs.  Adjust tx prn

## 2019-08-06 NOTE — Assessment & Plan Note (Signed)
Pt has hx of this.  Check labs.  Replete prn. 

## 2019-08-06 NOTE — Patient Instructions (Signed)
Follow up in 1 year or as needed We'll notify you of your lab results and make any changes if needed Keep up the good work!  You look great! They will call you to schedule your mammogram Call with any questions or concerns I am so sorry for your loss

## 2019-08-06 NOTE — Assessment & Plan Note (Signed)
Pt's PE WNL.  UTD on pap.  mammo ordered.  UTD on immunizations.  Check labs.  Anticipatory guidance provided.

## 2019-08-07 ENCOUNTER — Other Ambulatory Visit: Payer: Self-pay | Admitting: General Practice

## 2019-08-07 DIAGNOSIS — E1169 Type 2 diabetes mellitus with other specified complication: Secondary | ICD-10-CM

## 2019-08-07 MED ORDER — ATORVASTATIN CALCIUM 20 MG PO TABS: 20.0000 mg | ORAL_TABLET | Freq: Every day | ORAL | 12 refills | Status: DC

## 2019-08-07 MED ORDER — VITAMIN D (ERGOCALCIFEROL) 1.25 MG (50000 UNIT) PO CAPS: 50000.0000 [IU] | ORAL_CAPSULE | ORAL | 0 refills | Status: DC

## 2019-10-28 ENCOUNTER — Other Ambulatory Visit: Payer: Self-pay | Admitting: Family Medicine

## 2020-06-22 ENCOUNTER — Encounter: Payer: Self-pay | Admitting: Family Medicine

## 2020-06-22 ENCOUNTER — Ambulatory Visit (INDEPENDENT_AMBULATORY_CARE_PROVIDER_SITE_OTHER): Payer: Managed Care, Other (non HMO) | Admitting: Family Medicine

## 2020-06-22 ENCOUNTER — Other Ambulatory Visit: Payer: Self-pay

## 2020-06-22 VITALS — BP 112/80 | HR 74 | Temp 97.6°F | Resp 17 | Ht 59.0 in | Wt 123.8 lb

## 2020-06-22 DIAGNOSIS — J301 Allergic rhinitis due to pollen: Secondary | ICD-10-CM | POA: Diagnosis not present

## 2020-06-22 DIAGNOSIS — N912 Amenorrhea, unspecified: Secondary | ICD-10-CM

## 2020-06-22 DIAGNOSIS — R5383 Other fatigue: Secondary | ICD-10-CM | POA: Diagnosis not present

## 2020-06-22 LAB — CBC WITH DIFFERENTIAL/PLATELET
Basophils Absolute: 0.1 10*3/uL (ref 0.0–0.1)
Basophils Relative: 0.9 % (ref 0.0–3.0)
Eosinophils Absolute: 0.2 10*3/uL (ref 0.0–0.7)
Eosinophils Relative: 2.1 % (ref 0.0–5.0)
HCT: 37.8 % (ref 36.0–46.0)
Hemoglobin: 12.4 g/dL (ref 12.0–15.0)
Lymphocytes Relative: 30.9 % (ref 12.0–46.0)
Lymphs Abs: 2.4 10*3/uL (ref 0.7–4.0)
MCHC: 32.9 g/dL (ref 30.0–36.0)
MCV: 87 fl (ref 78.0–100.0)
Monocytes Absolute: 0.5 10*3/uL (ref 0.1–1.0)
Monocytes Relative: 6.5 % (ref 3.0–12.0)
Neutro Abs: 4.5 10*3/uL (ref 1.4–7.7)
Neutrophils Relative %: 59.6 % (ref 43.0–77.0)
Platelets: 286 10*3/uL (ref 150.0–400.0)
RBC: 4.34 Mil/uL (ref 3.87–5.11)
RDW: 13.4 % (ref 11.5–15.5)
WBC: 7.6 10*3/uL (ref 4.0–10.5)

## 2020-06-22 LAB — VITAMIN D 25 HYDROXY (VIT D DEFICIENCY, FRACTURES): VITD: 19.25 ng/mL — ABNORMAL LOW (ref 30.00–100.00)

## 2020-06-22 LAB — FOLLICLE STIMULATING HORMONE: FSH: 95.5 m[IU]/mL

## 2020-06-22 LAB — LUTEINIZING HORMONE: LH: 42.5 m[IU]/mL

## 2020-06-22 LAB — TSH: TSH: 2.76 u[IU]/mL (ref 0.35–4.50)

## 2020-06-22 LAB — B12 AND FOLATE PANEL
Folate: >24.4 ng/mL (ref >5.9)
Vitamin B-12: 450 pg/mL (ref 211–911)

## 2020-06-22 MED ORDER — LEVOCETIRIZINE DIHYDROCHLORIDE 5 MG PO TABS: 5.0000 mg | ORAL_TABLET | Freq: Every evening | ORAL | 3 refills | Status: DC

## 2020-06-22 NOTE — Assessment & Plan Note (Signed)
Pt doesn't feel her Zyrtec is strong enough.  Will switch to Xyzal and monitor for improvement.  Pt expressed understanding and is in agreement w/ plan.

## 2020-06-22 NOTE — Progress Notes (Signed)
   Subjective:    Patient ID: Gloria Buckley, female    DOB: 12-08-1975, 45 y.o.   MRN: [id number]  HPI Amenorrhea-  Pt reports she has not had a period 'in almost a year'.  Is having difficulty sleeping at night, some hot flashes.  Pt reports difficulty falling asleep.  Also has trouble staying asleep.  Mom stopped having periods in her early 56s.  + fatigue.  Denies cramping or abdominal pain.  Rare breast tenderness- occurs when 'really tired'  Allergic rhinitis- pt feels the Zyrtec is not effective.  Interested in switching meds.   Review of Systems For ROS see HPI   This visit occurred during the SARS-CoV-2 public health emergency.  Safety protocols were in place, including screening questions prior to the visit, additional usage of staff PPE, and extensive cleaning of exam room while observing appropriate contact time as indicated for disinfecting solutions.       Objective:   Physical Exam Vitals reviewed.  Constitutional:      General: She is not in acute distress.    Appearance: Normal appearance. She is well-developed. She is not ill-appearing.  HENT:     Head: Normocephalic and atraumatic.  Eyes:     Conjunctiva/sclera: Conjunctivae normal.     Pupils: Pupils are equal, round, and reactive to light.  Neck:     Thyroid: No thyromegaly.  Cardiovascular:     Rate and Rhythm: Normal rate and regular rhythm.     Pulses: Normal pulses.     Heart sounds: Normal heart sounds. No murmur heard.   Pulmonary:     Effort: Pulmonary effort is normal. No respiratory distress.     Breath sounds: Normal breath sounds.  Abdominal:     General: There is no distension.     Palpations: Abdomen is soft.     Tenderness: There is no abdominal tenderness.  Musculoskeletal:     Cervical back: Normal range of motion and neck supple.     Right lower leg: No edema.     Left lower leg: No edema.  Lymphadenopathy:     Cervical: No cervical adenopathy.  Skin:    General: Skin is warm and dry.   Neurological:     General: No focal deficit present.     Mental Status: She is alert and oriented to person, place, and time.  Psychiatric:        Mood and Affect: Mood normal.        Behavior: Behavior normal.        Thought Content: Thought content normal.           Assessment & Plan:  Amenorrhea- new.  Pt reports she has not had a period in nearly 1 year.  + hot flashes and poor sleep.  Remembers mom had previously told her she stopped having periods around age 30 (mom has since passed away).  Check FSH and LH to assess for menopausal status.  Pt expressed understanding and is in agreement w/ plan.   Fatigue- new.  May be related to ongoing menopause but will also check labs to determine if there's a vit deficiency or electrolyte disturbance.  Pt expressed understanding and is in agreement w/ plan.

## 2020-06-22 NOTE — Patient Instructions (Signed)
Follow up as needed or as scheduled We'll notify you of your lab results and make any changes if needed STOP the Zyrtec nightly and START the Xyzal Call with any questions or concerns ENJOY YOUR TRIP!!!!

## 2020-06-23 ENCOUNTER — Other Ambulatory Visit: Payer: Self-pay

## 2020-06-23 DIAGNOSIS — E559 Vitamin D deficiency, unspecified: Secondary | ICD-10-CM

## 2020-06-23 LAB — HEPATIC FUNCTION PANEL
ALT: 18 U/L (ref 0–35)
AST: 14 U/L (ref 0–37)
Albumin: 4.4 g/dL (ref 3.5–5.2)
Alkaline Phosphatase: 61 U/L (ref 39–117)
Bilirubin, Direct: 0.1 mg/dL (ref 0.0–0.3)
Total Bilirubin: 0.8 mg/dL (ref 0.2–1.2)
Total Protein: 7.8 g/dL (ref 6.0–8.3)

## 2020-06-23 LAB — BASIC METABOLIC PANEL
BUN: 15 mg/dL (ref 6–23)
CO2: 25 mEq/L (ref 19–32)
Calcium: 9.6 mg/dL (ref 8.4–10.5)
Chloride: 102 mEq/L (ref 96–112)
Creatinine, Ser: 0.58 mg/dL (ref 0.40–1.20)
GFR: 110.05 mL/min (ref >60.00)
Glucose, Bld: 91 mg/dL (ref 70–99)
Potassium: 3.8 mEq/L (ref 3.5–5.1)
Sodium: 140 mEq/L (ref 135–145)

## 2020-06-23 LAB — PROLACTIN: Prolactin: 9.5 ng/mL

## 2020-06-23 MED ORDER — VITAMIN D (ERGOCALCIFEROL) 1.25 MG (50000 UNIT) PO CAPS: 50000.0000 [IU] | ORAL_CAPSULE | ORAL | 0 refills | Status: DC

## 2020-07-15 ENCOUNTER — Encounter: Payer: Self-pay | Admitting: *Deleted

## 2020-08-11 ENCOUNTER — Encounter: Payer: Managed Care, Other (non HMO) | Admitting: Family Medicine

## 2020-08-14 ENCOUNTER — Other Ambulatory Visit: Payer: Self-pay

## 2020-08-14 ENCOUNTER — Ambulatory Visit (INDEPENDENT_AMBULATORY_CARE_PROVIDER_SITE_OTHER): Payer: Managed Care, Other (non HMO) | Admitting: Registered Nurse

## 2020-08-14 VITALS — BP 103/70 | HR 93 | Temp 98.3°F | Resp 18 | Ht 59.0 in | Wt 124.0 lb

## 2020-08-14 DIAGNOSIS — M5442 Lumbago with sciatica, left side: Secondary | ICD-10-CM

## 2020-08-14 MED ORDER — DICLOFENAC SODIUM 75 MG PO TBEC: 75.0000 mg | DELAYED_RELEASE_TABLET | Freq: Two times a day (BID) | ORAL | 0 refills | Status: DC

## 2020-08-14 MED ORDER — METHOCARBAMOL 500 MG PO TABS: 500.0000 mg | ORAL_TABLET | Freq: Three times a day (TID) | ORAL | 0 refills | Status: DC | PRN

## 2020-08-14 NOTE — Patient Instructions (Addendum)
Ms. Brassell -   Good to meet you. I'm sorry that you're in pain  Treat with Methocarbamol and diclofenac  Methocarbamol - muscle relaxer - ok to use up to three times daily. Might make you a little sleepy.   Diclofenac - antiinflammatory - ok to use twice daily as needed. Do not take aleve, ibuprofen, motrin, etc with this. It is ok to supplement with Tylenol (acetaminophen) if needed. This may upset your stomach, so take this with water and a snack.  If pain worsens or persists, call me. We can consider Xray of back and hips or Physical Therapy depending on how you're feeling.  Safe travels -   Rich     If you have lab work done today you will be contacted with your lab results within the next 2 weeks.  If you have not heard from [place] then please contact us. The fastest way to get your results is to register for My Chart.   IF you received an x-ray today, you will receive an invoice from [facility]. Please contact [facility] at [phone] with questions or concerns regarding your invoice.   IF you received labwork today, you will receive an invoice from [facility]. Please contact [facility] at [phone] with questions or concerns regarding your invoice.   Our billing staff will not be able to assist you with questions regarding bills from these companies.  You will be contacted with the lab results as soon as they are available. The fastest way to get your results is to activate your My Chart account. Instructions are located on the last page of this paperwork. If you have not heard from [place] regarding the results in 2 weeks, please contact this office.

## 2020-08-14 NOTE — Progress Notes (Signed)
Acute Office Visit  Subjective:    Patient ID: Gloria Buckley, female    DOB: 1975/03/09, 45 y.o.   MRN: [id number]  Chief Complaint  Patient presents with   Pain    Patient states she has been having some pain for a week that comes and goes in her left thigh/leg. Patient states pain comes and goes. Patient states she has been taking aleve  and get better fpr a short period of time    HPI Patient is in today for hip pain  L side. Onset one week ago. No acute trauma or injury noted.  Pain is intermittent. Shooting in nature. Down L leg into thigh, occ to foot.   Past Medical History:  Diagnosis Date   Hyperlipidemia     Past Surgical History:  Procedure Laterality Date   CESAREAN SECTION     2    No family history on file.  Social History   Socioeconomic History   Marital status: Married    Spouse name: Not on file   Number of children: Not on file   Years of education: Not on file   Highest education level: Not on file  Occupational History   Not on file  Tobacco Use   Smoking status: Never   Smokeless tobacco: Never  Vaping Use   Vaping Use: Never used  Substance and Sexual Activity   Alcohol use: No   Drug use: No   Sexual activity: Yes  Other Topics Concern   Not on file  Social History Narrative   Not on file   Social Determinants of Health   Financial Resource Strain: Not on file  Food Insecurity: Not on file  Transportation Needs: Not on file  Physical Activity: Not on file  Stress: Not on file  Social Connections: Not on file  Intimate Partner Violence: Not on file    Outpatient Medications Prior to Visit  Medication Sig Dispense Refill   atorvastatin (LIPITOR) 20 MG tablet Take 1 tablet (20 mg total) by mouth daily. 30 tablet 12   fluticasone (FLONASE) 50 MCG/ACT nasal spray SHAKE LIQUID AND USE 2 SPRAYS IN EACH NOSTRIL DAILY 16 g 6   levocetirizine (XYZAL ALLERGY 24HR) 5 MG tablet Take 1 tablet (5 mg total) by mouth every evening. 90  tablet 3   montelukast (SINGULAIR) 10 MG tablet Take 1 tablet (10 mg total) by mouth at bedtime. 30 tablet 11   MULTIPLE VITAMIN PO Take 1 tablet by mouth daily.     Vitamin D, Ergocalciferol, (DRISDOL) 1.25 MG (50000 UNIT) CAPS capsule Take 1 capsule (50,000 Units total) by mouth every 7 (seven) days. 12 capsule 0   No facility-administered medications prior to visit.    No Known Allergies  Review of Systems     Objective:    Physical Exam  BP 103/70   Pulse 93   Temp 98.3 F (36.8 C) (Temporal)   Resp 18   Ht 4\' 11"  (1.499 m)   Wt 124 lb (56.2 kg)   SpO2 99%   BMI 25.04 kg/m  Wt Readings from Last 3 Encounters:  08/14/20 124 lb (56.2 kg)  06/22/20 123 lb 12.8 oz (56.2 kg)  08/06/19 130 lb 8 oz (59.2 kg)    Health Maintenance Due  Topic Date Due   PAP SMEAR-Modifier  07/19/2020    There are no preventive care reminders to display for this patient.   Lab Results  Component Value Date   TSH 2.76 06/22/2020  Lab Results  Component Value Date   WBC 7.6 06/22/2020   HGB 12.4 06/22/2020   HCT 37.8 06/22/2020   MCV 87.0 06/22/2020   PLT 286.0 06/22/2020   Lab Results  Component Value Date   NA 140 06/22/2020   K 3.8 06/22/2020   CO2 25 06/22/2020   GLUCOSE 91 06/22/2020   BUN 15 06/22/2020   CREATININE 0.58 06/22/2020   BILITOT 0.8 06/22/2020   ALKPHOS 61 06/22/2020   AST 14 06/22/2020   ALT 18 06/22/2020   PROT 7.8 06/22/2020   ALBUMIN 4.4 06/22/2020   CALCIUM 9.6 06/22/2020   GFR 110.05 06/22/2020   Lab Results  Component Value Date   CHOL 262 (H) 08/06/2019   Lab Results  Component Value Date   HDL 69.00 08/06/2019   Lab Results  Component Value Date   LDLCALC 168 (H) 08/06/2019   Lab Results  Component Value Date   TRIG 127.0 08/06/2019   Lab Results  Component Value Date   CHOLHDL 4 08/06/2019   No results found for: HGBA1C     Assessment & Plan:   Problem List Items Addressed This Visit   None Visit Diagnoses      Acute left-sided low back pain with left-sided sciatica    -  Primary   Relevant Medications   diclofenac (VOLTAREN) 75 MG EC tablet   methocarbamol (ROBAXIN) 500 MG tablet        Meds ordered this encounter  Medications   diclofenac (VOLTAREN) 75 MG EC tablet    Sig: Take 1 tablet (75 mg total) by mouth 2 (two) times daily.    Dispense:  30 tablet    Refill:  0    Order Specific Question:   Supervising Provider    Answer:   Neva Seat, JEFFREY R [[id number]]   methocarbamol (ROBAXIN) 500 MG tablet    Sig: Take 1 tablet (500 mg total) by mouth every 8 (eight) hours as needed for muscle spasms.    Dispense:  60 tablet    Refill:  0    Order Specific Question:   Supervising Provider    Answer:   Neva Seat, JEFFREY R [[id number]]   PLAN Lower back pain with L side sciatica. Responded well to OTC analgesics. Will give diclofenac and methocarbamol as above Encourage stretching lower back, hips, and legs 20-30 minutes daily.  Return if worsening or failing to improve. Chronically low vitamin D - may be worthwhile to check xray lumbar spine and hips to rule out fx. Can also consider PT Patient encouraged to call clinic with any questions, comments, or concerns.   Janeece Agee, NP

## 2020-08-15 ENCOUNTER — Encounter (HOSPITAL_COMMUNITY): Payer: Self-pay | Admitting: Emergency Medicine

## 2020-08-15 ENCOUNTER — Emergency Department (HOSPITAL_COMMUNITY)
Admission: EM | Admit: 2020-08-15 | Discharge: 2020-08-15 | Disposition: A | Discharge status: Home / Self Care | Payer: 59 | Attending: Emergency Medicine | Admitting: Emergency Medicine

## 2020-08-15 DIAGNOSIS — Z5321 Procedure and treatment not carried out due to patient leaving prior to being seen by health care provider: Secondary | ICD-10-CM | POA: Insufficient documentation

## 2020-08-15 DIAGNOSIS — M79605 Pain in left leg: Secondary | ICD-10-CM | POA: Diagnosis not present

## 2020-08-15 DIAGNOSIS — R21 Rash and other nonspecific skin eruption: Secondary | ICD-10-CM | POA: Insufficient documentation

## 2020-08-15 NOTE — ED Triage Notes (Signed)
Patient has a vesicular rash on her L foot since yesterday, L leg pain x1 week. Went to PCP and has been taking muscle relaxers w/o relief.

## 2020-08-18 ENCOUNTER — Encounter: Payer: Managed Care, Other (non HMO) | Admitting: Family Medicine

## 2020-08-28 ENCOUNTER — Ambulatory Visit (INDEPENDENT_AMBULATORY_CARE_PROVIDER_SITE_OTHER): Payer: 59 | Admitting: Registered Nurse

## 2020-08-28 ENCOUNTER — Other Ambulatory Visit: Payer: Self-pay

## 2020-08-28 ENCOUNTER — Encounter: Payer: Self-pay | Admitting: Registered Nurse

## 2020-08-28 VITALS — BP 110/70 | HR 83 | Temp 98.3°F | Resp 16 | Ht 59.0 in | Wt 121.6 lb

## 2020-08-28 DIAGNOSIS — B029 Zoster without complications: Secondary | ICD-10-CM

## 2020-08-28 MED ORDER — GABAPENTIN 300 MG PO CAPS: ORAL_CAPSULE | ORAL | 3 refills | Status: DC

## 2020-08-28 MED ORDER — HYDROCODONE-ACETAMINOPHEN 7.5-300 MG PO TABS: 1.0000 | ORAL_TABLET | Freq: Three times a day (TID) | ORAL | 0 refills | Status: DC | PRN

## 2020-08-28 NOTE — Progress Notes (Signed)
Established Patient Office Visit  Subjective:  Patient ID: Gloria Buckley, female    DOB: [birth year]  Age: 45 y.o. MRN: [id number]  CC:  Chief Complaint  Patient presents with   Follow-up    Patient states she is here for follow up for foot pain . Per patient it was hurting when she last saw you she went to urgent care and was told dhe had shingles. Patient would like to discuss    HPI Gloria Buckley presents for follow up   Seen by myself for back pain with presumed neuropathy on 7/29. Unfortunately then had rash erupt - seen by urgent care, dx with herpes zoster.   Given valtrex, gabapentin 300mg  po tid, and hydrocodone.  Greatly improved Still intermittent burning and tingling Rash looks like it's drying up, healing.  No new or worsening symptoms  Is out of hydrocodone and gabapentin, would like refill for intermittent use.   Past Medical History:  Diagnosis Date   Hyperlipidemia     Past Surgical History:  Procedure Laterality Date   CESAREAN SECTION     2    No family history on file.  Social History   Socioeconomic History   Marital status: Married    Spouse name: Not on file   Number of children: Not on file   Years of education: Not on file   Highest education level: Not on file  Occupational History   Not on file  Tobacco Use   Smoking status: Never   Smokeless tobacco: Never  Vaping Use   Vaping Use: Never used  Substance and Sexual Activity   Alcohol use: No   Drug use: No   Sexual activity: Yes  Other Topics Concern   Not on file  Social History Narrative   Not on file   Social Determinants of Health   Financial Resource Strain: Not on file  Food Insecurity: Not on file  Transportation Needs: Not on file  Physical Activity: Not on file  Stress: Not on file  Social Connections: Not on file  Intimate Partner Violence: Not on file    Outpatient Medications Prior to Visit  Medication Sig Dispense Refill   atorvastatin (LIPITOR) 20 MG  tablet Take 1 tablet (20 mg total) by mouth daily. 30 tablet 12   diclofenac (VOLTAREN) 75 MG EC tablet Take 1 tablet (75 mg total) by mouth 2 (two) times daily. 30 tablet 0   fluticasone (FLONASE) 50 MCG/ACT nasal spray SHAKE LIQUID AND USE 2 SPRAYS IN EACH NOSTRIL DAILY 16 g 6   levocetirizine (XYZAL ALLERGY 24HR) 5 MG tablet Take 1 tablet (5 mg total) by mouth every evening. 90 tablet 3   methocarbamol (ROBAXIN) 500 MG tablet Take 1 tablet (500 mg total) by mouth every 8 (eight) hours as needed for muscle spasms. 60 tablet 0   montelukast (SINGULAIR) 10 MG tablet Take 1 tablet (10 mg total) by mouth at bedtime. 30 tablet 11   MULTIPLE VITAMIN PO Take 1 tablet by mouth daily.     Vitamin D, Ergocalciferol, (DRISDOL) 1.25 MG (50000 UNIT) CAPS capsule Take 1 capsule (50,000 Units total) by mouth every 7 (seven) days. 12 capsule 0   No facility-administered medications prior to visit.    No Known Allergies  ROS Review of Systems  Constitutional: Negative.   HENT: Negative.    Eyes: Negative.   Respiratory: Negative.    Cardiovascular: Negative.   Gastrointestinal: Negative.   Genitourinary: Negative.   Musculoskeletal: Negative.   Skin:  Negative.   Neurological: Negative.   Psychiatric/Behavioral: Negative.    All other systems reviewed and are negative.    Objective:    Physical Exam Vitals and nursing note reviewed.  Constitutional:      General: She is not in acute distress.    Appearance: Normal appearance. She is normal weight. She is not ill-appearing, toxic-appearing or diaphoretic.  Cardiovascular:     Rate and Rhythm: Normal rate and regular rhythm.     Heart sounds: Normal heart sounds. No murmur heard.   No friction rub. No gallop.  Pulmonary:     Effort: Pulmonary effort is normal. No respiratory distress.     Breath sounds: Normal breath sounds. No stridor. No wheezing, rhonchi or rales.  Chest:     Chest wall: No tenderness.  Skin:    General: Skin is warm  and dry.     Findings: Lesion (shingles rash L4 dermatome) present.  Neurological:     General: No focal deficit present.     Mental Status: She is alert and oriented to person, place, and time. Mental status is at baseline.  Psychiatric:        Mood and Affect: Mood normal.        Behavior: Behavior normal.        Thought Content: Thought content normal.        Judgment: Judgment normal.    BP 110/70   Pulse 83   Temp 98.3 F (36.8 C) (Temporal)   Resp 16   Ht 4\' 11"  (1.499 m)   Wt 121 lb 9.6 oz (55.2 kg)   LMP 09/25/2017 (Approximate)   SpO2 98%   BMI 24.56 kg/m  Wt Readings from Last 3 Encounters:  08/28/20 121 lb 9.6 oz (55.2 kg)  08/14/20 124 lb (56.2 kg)  06/22/20 123 lb 12.8 oz (56.2 kg)     Health Maintenance Due  Topic Date Due   PAP SMEAR-Modifier  07/19/2020   INFLUENZA VACCINE  08/17/2020    There are no preventive care reminders to display for this patient.  Lab Results  Component Value Date   TSH 2.76 06/22/2020   Lab Results  Component Value Date   WBC 7.6 06/22/2020   HGB 12.4 06/22/2020   HCT 37.8 06/22/2020   MCV 87.0 06/22/2020   PLT 286.0 06/22/2020   Lab Results  Component Value Date   NA 140 06/22/2020   K 3.8 06/22/2020   CO2 25 06/22/2020   GLUCOSE 91 06/22/2020   BUN 15 06/22/2020   CREATININE 0.58 06/22/2020   BILITOT 0.8 06/22/2020   ALKPHOS 61 06/22/2020   AST 14 06/22/2020   ALT 18 06/22/2020   PROT 7.8 06/22/2020   ALBUMIN 4.4 06/22/2020   CALCIUM 9.6 06/22/2020   GFR 110.05 06/22/2020   Lab Results  Component Value Date   CHOL 262 (H) 08/06/2019   Lab Results  Component Value Date   HDL 69.00 08/06/2019   Lab Results  Component Value Date   LDLCALC 168 (H) 08/06/2019   Lab Results  Component Value Date   TRIG 127.0 08/06/2019   Lab Results  Component Value Date   CHOLHDL 4 08/06/2019   No results found for: HGBA1C    Assessment & Plan:   Problem List Items Addressed This Visit   None   No  orders of the defined types were placed in this encounter.   Follow-up: No follow-ups on file.   PLAN Reviewed pathophys of shingles with patient who  voices understanding Refill gabapentin and hydrocodone as above Reviewed r/b/se of these medications. Pt voices understanding Return prn Patient encouraged to call clinic with any questions, comments, or concerns.  Janeece Agee, NP

## 2020-08-28 NOTE — Patient Instructions (Addendum)
Ms. Reeser -   Glad the shingles is improving. See below for some general information on shingles. Let me know if it looks like there's any infection or anything new pops up  We will vaccinate you against Shingles at age 45.   I have refilled gabapentin and hydrocodone -   Gabapentin - take 300mg  three times daily as needed. Ok to take 600mg  before bed if pain, tingling, or burning is worse.  Hydrocodone - only take if needed for pain. Use sparingly.  Thank you  Rich     If you have lab work done today you will be contacted with your lab results within the next 2 weeks.  If you have not heard from then please contact . The fastest way to get your results is to register for My Chart.   IF you received an x-ray today, you will receive an invoice from [facility]. Please contact [facility] at [phone] with questions or concerns regarding your invoice.   IF you received labwork today, you will receive an invoice from [place]. Please contact [facility] at [phone] with questions or concerns regarding your invoice.   Our billing staff will not be able to assist you with questions regarding bills from these companies.  You will be contacted with the lab results as soon as they are available. The fastest way to get your results is to activate your My Chart account. Instructions are located on the last page of this paperwork. If you have not heard from [place] regarding the results in 2 weeks, please contact this office.

## 2020-09-23 ENCOUNTER — Encounter: Payer: Managed Care, Other (non HMO) | Admitting: Family Medicine

## 2020-12-15 ENCOUNTER — Encounter: Payer: 59 | Admitting: Family Medicine

## 2021-02-18 ENCOUNTER — Ambulatory Visit (INDEPENDENT_AMBULATORY_CARE_PROVIDER_SITE_OTHER): Payer: Self-pay | Admitting: Family Medicine

## 2021-02-18 ENCOUNTER — Other Ambulatory Visit (HOSPITAL_COMMUNITY)
Admission: RE | Admit: 2021-02-18 | Discharge: 2021-02-18 | Disposition: A | Discharge status: Home / Self Care | Payer: Managed Care, Other (non HMO) | Source: Ambulatory Visit | Attending: Family Medicine | Admitting: Family Medicine

## 2021-02-18 ENCOUNTER — Encounter: Payer: Self-pay | Admitting: Family Medicine

## 2021-02-18 VITALS — BP 110/80 | HR 73 | Temp 97.8°F | Resp 16 | Ht 59.0 in | Wt 121.4 lb

## 2021-02-18 DIAGNOSIS — Z0000 Encounter for general adult medical examination without abnormal findings: Secondary | ICD-10-CM

## 2021-02-18 DIAGNOSIS — E559 Vitamin D deficiency, unspecified: Secondary | ICD-10-CM

## 2021-02-18 DIAGNOSIS — Z1231 Encounter for screening mammogram for malignant neoplasm of breast: Secondary | ICD-10-CM

## 2021-02-18 DIAGNOSIS — Z1159 Encounter for screening for other viral diseases: Secondary | ICD-10-CM

## 2021-02-18 DIAGNOSIS — Z124 Encounter for screening for malignant neoplasm of cervix: Secondary | ICD-10-CM | POA: Insufficient documentation

## 2021-02-18 DIAGNOSIS — E785 Hyperlipidemia, unspecified: Secondary | ICD-10-CM

## 2021-02-18 DIAGNOSIS — Z1211 Encounter for screening for malignant neoplasm of colon: Secondary | ICD-10-CM

## 2021-02-18 DIAGNOSIS — Z114 Encounter for screening for human immunodeficiency virus [HIV]: Secondary | ICD-10-CM

## 2021-02-18 LAB — CBC WITH DIFFERENTIAL/PLATELET
Basophils Absolute: 0 10*3/uL (ref 0.0–0.1)
Basophils Relative: 0.6 % (ref 0.0–3.0)
Eosinophils Absolute: 0.2 10*3/uL (ref 0.0–0.7)
Eosinophils Relative: 3.2 % (ref 0.0–5.0)
HCT: 38.8 % (ref 36.0–46.0)
Hemoglobin: 12.6 g/dL (ref 12.0–15.0)
Lymphocytes Relative: 36 % (ref 12.0–46.0)
Lymphs Abs: 1.9 10*3/uL (ref 0.7–4.0)
MCHC: 32.5 g/dL (ref 30.0–36.0)
MCV: 86.9 fl (ref 78.0–100.0)
Monocytes Absolute: 0.3 10*3/uL (ref 0.1–1.0)
Monocytes Relative: 6.2 % (ref 3.0–12.0)
Neutro Abs: 2.9 10*3/uL (ref 1.4–7.7)
Neutrophils Relative %: 54 % (ref 43.0–77.0)
Platelets: 262 10*3/uL (ref 150.0–400.0)
RBC: 4.46 Mil/uL (ref 3.87–5.11)
RDW: 13.2 % (ref 11.5–15.5)
WBC: 5.3 10*3/uL (ref 4.0–10.5)

## 2021-02-18 LAB — VITAMIN D 25 HYDROXY (VIT D DEFICIENCY, FRACTURES): VITD: 31.22 ng/mL (ref 30.00–100.00)

## 2021-02-18 LAB — LIPID PANEL
Cholesterol: 301 mg/dL — ABNORMAL HIGH (ref 0–200)
HDL: 86.5 mg/dL (ref >39.00)
LDL Cholesterol: 196 mg/dL — ABNORMAL HIGH (ref 0–99)
NonHDL: 214.36
Total CHOL/HDL Ratio: 3
Triglycerides: 92 mg/dL (ref 0.0–149.0)
VLDL: 18.4 mg/dL (ref 0.0–40.0)

## 2021-02-18 LAB — BASIC METABOLIC PANEL
BUN: 13 mg/dL (ref 6–23)
CO2: 29 mEq/L (ref 19–32)
Calcium: 9.7 mg/dL (ref 8.4–10.5)
Chloride: 103 mEq/L (ref 96–112)
Creatinine, Ser: 0.61 mg/dL (ref 0.40–1.20)
GFR: 108.22 mL/min (ref >60.00)
Glucose, Bld: 82 mg/dL (ref 70–99)
Potassium: 3.7 mEq/L (ref 3.5–5.1)
Sodium: 140 mEq/L (ref 135–145)

## 2021-02-18 LAB — HEPATIC FUNCTION PANEL
ALT: 10 U/L (ref 0–35)
AST: 15 U/L (ref 0–37)
Albumin: 4.6 g/dL (ref 3.5–5.2)
Alkaline Phosphatase: 52 U/L (ref 39–117)
Bilirubin, Direct: 0.1 mg/dL (ref 0.0–0.3)
Total Bilirubin: 0.6 mg/dL (ref 0.2–1.2)
Total Protein: 8 g/dL (ref 6.0–8.3)

## 2021-02-18 LAB — TSH: TSH: 1.51 u[IU]/mL (ref 0.35–5.50)

## 2021-02-18 NOTE — Assessment & Plan Note (Signed)
Chronic problem.  Stopped her cholesterol medication.  Check labs and restart if needed

## 2021-02-18 NOTE — Patient Instructions (Signed)
Follow up in 1 year or as needed We'll notify you of your lab results and make any changes if needed We'll call you to schedule your mammogram and your GI appt for colonoscopy Keep up the good work on healthy diet and regular exercise- you look great! Call with any questions or concerns Stay Safe!  Stay Healthy! Happy New Year!!!

## 2021-02-18 NOTE — Assessment & Plan Note (Signed)
Check labs and replete prn. 

## 2021-02-18 NOTE — Assessment & Plan Note (Signed)
Pt's PE WNL.  UTD on Tdap.  Pap done today.  Mammo ordered and GI referral placed.  Check labs.  Anticipatory guidance provided.

## 2021-02-18 NOTE — Progress Notes (Signed)
° °  Subjective:    Patient ID: Gloria Buckley, female    DOB: 05/06/1975, 46 y.o.   MRN: [id number]  HPI CPE- due for pap, mammo, colonoscopy.  UTD on Tdap.  Health Maintenance  Topic Date Due   URINE MICROALBUMIN  Never done   COVID-19 Vaccine (2 - Pfizer series) 05/21/2019   PAP SMEAR-Modifier  07/19/2020   COLONOSCOPY (Pts 45-70yrs Insurance coverage will need to be confirmed)  Never done   INFLUENZA VACCINE  04/16/2021 (Originally 08/17/2020)   Hepatitis C Screening  08/14/2021 (Originally 01/17/1994)   HIV Screening  08/14/2021 (Originally 01/18/1991)   TETANUS/TDAP  02/18/2026   HPV VACCINES  Aged Out      Review of Systems Patient reports no vision/ hearing changes, adenopathy,fever, weight change,  persistant/recurrent hoarseness , swallowing issues, chest pain, palpitations, edema, persistant/recurrent cough, hemoptysis, dyspnea (rest/exertional/paroxysmal nocturnal), gastrointestinal bleeding (melena, rectal bleeding), abdominal pain, significant heartburn, bowel changes, GU symptoms (dysuria, hematuria, incontinence), Gyn symptoms (abnormal  bleeding, pain),  syncope, focal weakness, memory loss, numbness & tingling, skin/hair/nail changes, abnormal bruising or bleeding, anxiety, or depression.   This visit occurred during the SARS-CoV-2 public health emergency.  Safety protocols were in place, including screening questions prior to the visit, additional usage of staff PPE, and extensive cleaning of exam room while observing appropriate contact time as indicated for disinfecting solutions.      Objective:   Physical Exam  General Appearance:    Alert, cooperative, no distress, appears stated age  Head:    Normocephalic, without obvious abnormality, atraumatic  Eyes:    PERRL, conjunctiva/corneas clear, EOM's intact, fundi    benign, both eyes  Ears:    Normal TM's and external ear canals, both ears  Nose:   Deferred due to COVID  Throat:   Neck:   Supple, symmetrical, trachea  midline, no adenopathy;    Thyroid: no enlargement/tenderness/nodules  Back:     Symmetric, no curvature, ROM normal, no CVA tenderness  Lungs:     Clear to auscultation bilaterally, respirations unlabored  Chest Wall:    No tenderness or deformity   Heart:    Regular rate and rhythm, S1 and S2 normal, no murmur, rub   or gallop  Breast Exam:    Deferred to mammo  Abdomen:     Soft, non-tender, bowel sounds active all four quadrants,    no masses, no organomegaly  Genitalia:    External genitalia normal, cervix normal in appearance, no CMT, uterus in normal size and position, adnexa w/out mass or tenderness, mucosa pink and moist, no lesions or discharge present  Rectal:    Normal external appearance  Extremities:   Extremities normal, atraumatic, no cyanosis or edema  Pulses:   2+ and symmetric all extremities  Skin:   Skin color, texture, turgor normal, no rashes or lesions  Lymph nodes:   Cervical, supraclavicular, and axillary nodes normal  Neurologic:   CNII-XII intact, normal strength, sensation and reflexes    throughout          Assessment & Plan:

## 2021-02-19 LAB — HIV ANTIBODY (ROUTINE TESTING W REFLEX): HIV 1&2 Ab, 4th Generation: NONREACTIVE

## 2021-02-19 LAB — HEPATITIS C ANTIBODY
Hepatitis C Ab: NONREACTIVE
SIGNAL TO CUT-OFF: 0.04 (ref <1.00)

## 2021-02-19 LAB — CYTOLOGY - PAP
Comment: NEGATIVE
Diagnosis: NEGATIVE
High risk HPV: NEGATIVE

## 2021-04-06 ENCOUNTER — Ambulatory Visit
Admission: RE | Admit: 2021-04-06 | Discharge: 2021-04-06 | Disposition: A | Discharge status: Home / Self Care | Payer: PRIVATE HEALTH INSURANCE | Source: Ambulatory Visit | Attending: Family Medicine | Admitting: Family Medicine

## 2021-04-06 DIAGNOSIS — Z1231 Encounter for screening mammogram for malignant neoplasm of breast: Secondary | ICD-10-CM

## 2021-04-16 ENCOUNTER — Encounter: Payer: Self-pay | Admitting: Family Medicine

## 2021-04-19 ENCOUNTER — Encounter: Payer: Self-pay | Admitting: Internal Medicine

## 2021-05-03 ENCOUNTER — Other Ambulatory Visit: Payer: Self-pay

## 2021-05-03 ENCOUNTER — Ambulatory Visit (AMBULATORY_SURGERY_CENTER): Payer: Self-pay | Admitting: *Deleted

## 2021-05-03 VITALS — Ht 59.0 in | Wt 120.0 lb

## 2021-05-03 DIAGNOSIS — Z1211 Encounter for screening for malignant neoplasm of colon: Secondary | ICD-10-CM

## 2021-05-03 MED ORDER — NA SULFATE-K SULFATE-MG SULF 17.5-3.13-1.6 GM/177ML PO SOLN: 1.0000 | Freq: Once | ORAL | 0 refills | Status: AC

## 2021-05-03 NOTE — Progress Notes (Signed)
No egg or soy allergy known to patient  ?No issues known to pt with past sedation with any surgeries or procedures ?Patient denies ever being told they had issues or difficulty with intubation  ?No FH of Malignant Hyperthermia ?Pt is not on diet pills ?Pt is not on  home 02  ?Pt is not on blood thinners  ?Pt denies issues with constipation  ?No A fib or A flutter ? ?suprep Coupon to pt in PV today , Code to Pharmacy and  NO PA's for preps discussed with pt In PV today  ?Discussed with pt there will be an out-of-pocket cost for prep and that varies from $0 to 70 +  dollars - pt verbalized understanding  ?Pt instructed to use Singlecare.com or GoodRx for a price reduction on prep  ? ?PV completed over the phone. Pt verified name, DOB, address and insurance during PV today.  ?Pt mailed instruction packet with copy of consent form to read and not return, and instructions.  ?Pt encouraged to call with questions or issues.  ?If pt has My chart, procedure instructions sent via My Chart   ?

## 2021-05-13 ENCOUNTER — Ambulatory Visit: Payer: PRIVATE HEALTH INSURANCE | Admitting: Family Medicine

## 2021-05-17 ENCOUNTER — Encounter: Payer: PRIVATE HEALTH INSURANCE | Admitting: Internal Medicine

## 2021-05-27 ENCOUNTER — Encounter: Payer: Self-pay | Admitting: Internal Medicine

## 2021-06-01 ENCOUNTER — Encounter: Payer: Self-pay | Admitting: Internal Medicine

## 2021-06-01 ENCOUNTER — Ambulatory Visit (AMBULATORY_SURGERY_CENTER): Payer: PRIVATE HEALTH INSURANCE | Admitting: Internal Medicine

## 2021-06-01 VITALS — BP 106/64 | HR 68 | Temp 98.4°F | Resp 15 | Ht 59.0 in | Wt 116.0 lb

## 2021-06-01 DIAGNOSIS — Z1211 Encounter for screening for malignant neoplasm of colon: Secondary | ICD-10-CM

## 2021-06-01 MED ORDER — SODIUM CHLORIDE 0.9 % IV SOLN: 500.0000 mL | Freq: Once | INTRAVENOUS | Status: DC

## 2021-06-01 NOTE — Progress Notes (Signed)
Pt's states no medical or surgical changes since previsit or office visit. 

## 2021-06-01 NOTE — Op Note (Signed)
[place] ?Patient Name: Gloria Buckley ?Procedure Date: 06/01/2021 2:46 PM ?MRN: [id number] ?Endoscopist: Sonny Masters "Christia Reading ,  ?Age: 46 ?Referring MD:  ?Date of Birth: 04/11/1975 ?Gender: Female ?Account #: [id number] ?Procedure:                Colonoscopy ?Indications:              Screening for colorectal malignant neoplasm, This  ?                          is the patient's first colonoscopy ?Medicines:                Monitored Anesthesia Care ?Procedure:                Pre-Anesthesia Assessment: ?                          - Prior to the procedure, a History and Physical  ?                          was performed, and patient medications and  ?                          allergies were reviewed. The patient's tolerance of  ?                          previous anesthesia was also reviewed. The risks  ?                          and benefits of the procedure and the sedation  ?                          options and risks were discussed with the patient.  ?                          All questions were answered, and informed consent  ?                          was obtained. Prior Anticoagulants: The patient has  ?                          taken no previous anticoagulant or antiplatelet  ?                          agents. ASA Grade Assessment: II - A patient with  ?                          mild systemic disease. After reviewing the risks  ?                          and benefits, the patient was deemed in  ?                          satisfactory condition to undergo the procedure. ?  After obtaining informed consent, the colonoscope  ?                          was passed under direct vision. Throughout the  ?                          procedure, the patient's blood pressure, pulse, and  ?                          oxygen saturations were monitored continuously. The  ?                          Olympus [id number]) Colonoscope was  ?                          introduced through the  anus and advanced to the the  ?                          terminal ileum. The colonoscopy was performed  ?                          without difficulty. The patient tolerated the  ?                          procedure well. The quality of the bowel  ?                          preparation was excellent. The terminal ileum,  ?                          ileocecal valve, appendiceal orifice, and rectum  ?                          were photographed. ?Scope In: 2:49:31 PM ?Scope Out: 3:03:30 PM ?Scope Withdrawal Time: 0 hours 9 minutes 51 seconds  ?Total Procedure Duration: 0 hours 13 minutes 59 seconds  ?Findings:                 The terminal ileum appeared normal. ?                          Non-bleeding internal hemorrhoids were found during  ?                          retroflexion. ?                          The exam was otherwise without abnormality. ?Complications:            No immediate complications. ?Estimated Blood Loss:     Estimated blood loss: none. ?Impression:               - The examined portion of the ileum was normal. ?                          - Non-bleeding internal hemorrhoids. ?                          -  The examination was otherwise normal. ?                          - No specimens collected. ?Recommendation:           - Discharge patient to home (with escort). ?                          - Repeat colonoscopy in 10 years for screening  ?                          purposes. ?                          - The findings and recommendations were discussed  ?                          with the patient. ?Georgian Co,  ?06/01/2021 3:07:49 PM ?

## 2021-06-01 NOTE — Progress Notes (Signed)
? ?GASTROENTEROLOGY PROCEDURE H&P NOTE  ? ?Primary Care Physician: ?Sheliah Hatch, MD ? ? ? ?Reason for Procedure:   Colon cancer screening ? ?Plan:    Colonoscopy ? ?Patient is appropriate for endoscopic procedure(s) in the ambulatory ([facility]) setting. ? ?The nature of the procedure, as well as the risks, benefits, and alternatives were carefully and thoroughly reviewed with the patient. Ample time for discussion and questions allowed. The patient understood, was satisfied, and agreed to proceed.  ? ? ? ?HPI: ?Gloria Buckley is a 46 y.o. female who presents for colonoscopy for colon cancer screening. Denies blood in stools, changes in bowel habits, weight loss. Denies fam hx of colon cancer. ? ?Past Medical History:  ?Diagnosis Date  ? Allergy   ? seasonal  ? Carpal tunnel syndrome   ? right  ? Hyperlipidemia   ? ? ?Past Surgical History:  ?Procedure Laterality Date  ? CESAREAN SECTION    ? 2  ? ? ?Prior to Admission medications   ?Medication Sig Start Date End Date Taking? Authorizing Provider  ?COLLAGEN PO Take by mouth daily. Take one   Yes [provider]  ?diclofenac (VOLTAREN) 75 MG EC tablet Take 1 tablet (75 mg total) by mouth 2 (two) times daily. 08/14/20   Janeece Agee, NP  ?levocetirizine Elita Boone ALLERGY 24HR) 5 MG tablet Take 1 tablet (5 mg total) by mouth every evening. 06/22/20   Sheliah Hatch, MD  ? ? ?Current Outpatient Medications  ?Medication Sig Dispense Refill  ? COLLAGEN PO Take by mouth daily. Take one    ? diclofenac (VOLTAREN) 75 MG EC tablet Take 1 tablet (75 mg total) by mouth 2 (two) times daily. 30 tablet 0  ? levocetirizine (XYZAL ALLERGY 24HR) 5 MG tablet Take 1 tablet (5 mg total) by mouth every evening. 90 tablet 3  ? ?Current Facility-Administered Medications  ?Medication Dose Route Frequency Provider Last Rate Last Admin  ? 0.9 %  sodium chloride infusion  500 mL Intravenous Once Imogene Burn, MD      ? ? ?Allergies as of 06/01/2021  ? (No Known Allergies)   ? ? ?Family History  ?Problem Relation Age of Onset  ? Breast cancer Neg Hx   ? Colon cancer Neg Hx   ? Colon polyps Neg Hx   ? Crohn's disease Neg Hx   ? Esophageal cancer Neg Hx   ? Rectal cancer Neg Hx   ? Stomach cancer Neg Hx   ? ? ?Social History  ? ?Socioeconomic History  ? Marital status: Married  ?  Spouse name: Not on file  ? Number of children: Not on file  ? Years of education: Not on file  ? Highest education level: Not on file  ?Occupational History  ? Not on file  ?Tobacco Use  ? Smoking status: Never  ?  Passive exposure: Never  ? Smokeless tobacco: Never  ?Vaping Use  ? Vaping Use: Never used  ?Substance and Sexual Activity  ? Alcohol use: No  ? Drug use: No  ? Sexual activity: Yes  ?Other Topics Concern  ? Not on file  ?Social History Narrative  ? Not on file  ? ?Social Determinants of Health  ? ?Financial Resource Strain: Not on file  ?Food Insecurity: Not on file  ?Transportation Needs: Not on file  ?Physical Activity: Not on file  ?Stress: Not on file  ?Social Connections: Not on file  ?Intimate Partner Violence: Not on file  ? ? ?Physical Exam: ?Vital signs  in last 24 hours: ?BP (!) 90/47   Pulse 64   Temp 98.4 ?F (36.9 ?C)   Ht 4\' 11"  (1.499 m)   Wt 116 lb (52.6 kg)   LMP 09/25/2017 (Approximate)   SpO2 98%   BMI 23.43 kg/m?  ?GEN: NAD ?EYE: Sclerae anicteric ?ENT: MMM ?CV: Non-tachycardic ?Pulm: No increased work of breathing ?GI: Soft, NT/ND ?NEURO:  Alert & Oriented ? ? ?11/25/2017, MD ?[facility] ? ?06/01/2021 2:19 PM ? ?

## 2021-06-01 NOTE — Patient Instructions (Signed)

## 2021-06-01 NOTE — Progress Notes (Signed)
PT taken to PACU. Monitors in place. VSS. Report given to RN. 

## 2021-06-03 ENCOUNTER — Telehealth: Payer: Self-pay | Admitting: *Deleted

## 2021-06-03 NOTE — Telephone Encounter (Signed)
Attempted to call patient for their post-procedure follow-up call. No answer. Left voicemail.   

## 2021-09-22 ENCOUNTER — Other Ambulatory Visit: Payer: Self-pay

## 2021-09-22 ENCOUNTER — Encounter: Payer: Self-pay | Admitting: Family Medicine

## 2021-09-22 ENCOUNTER — Ambulatory Visit (INDEPENDENT_AMBULATORY_CARE_PROVIDER_SITE_OTHER): Payer: PRIVATE HEALTH INSURANCE | Admitting: Family Medicine

## 2021-09-22 VITALS — BP 116/66 | HR 82 | Temp 97.6°F | Resp 17 | Ht 59.0 in | Wt 123.4 lb

## 2021-09-22 DIAGNOSIS — R202 Paresthesia of skin: Secondary | ICD-10-CM | POA: Diagnosis not present

## 2021-09-22 DIAGNOSIS — L659 Nonscarring hair loss, unspecified: Secondary | ICD-10-CM | POA: Diagnosis not present

## 2021-09-22 DIAGNOSIS — R200 Anesthesia of skin: Secondary | ICD-10-CM

## 2021-09-22 DIAGNOSIS — L989 Disorder of the skin and subcutaneous tissue, unspecified: Secondary | ICD-10-CM | POA: Diagnosis not present

## 2021-09-22 DIAGNOSIS — M79645 Pain in left finger(s): Secondary | ICD-10-CM | POA: Diagnosis not present

## 2021-09-22 LAB — CBC WITH DIFFERENTIAL/PLATELET
Basophils Absolute: 0 10*3/uL (ref 0.0–0.1)
Basophils Relative: 0.5 % (ref 0.0–3.0)
Eosinophils Absolute: 0.2 10*3/uL (ref 0.0–0.7)
Eosinophils Relative: 2.9 % (ref 0.0–5.0)
HCT: 37.6 % (ref 36.0–46.0)
Hemoglobin: 12.5 g/dL (ref 12.0–15.0)
Lymphocytes Relative: 36.3 % (ref 12.0–46.0)
Lymphs Abs: 1.9 10*3/uL (ref 0.7–4.0)
MCHC: 33.2 g/dL (ref 30.0–36.0)
MCV: 87.1 fl (ref 78.0–100.0)
Monocytes Absolute: 0.3 10*3/uL (ref 0.1–1.0)
Monocytes Relative: 6.5 % (ref 3.0–12.0)
Neutro Abs: 2.9 10*3/uL (ref 1.4–7.7)
Neutrophils Relative %: 53.8 % (ref 43.0–77.0)
Platelets: 235 10*3/uL (ref 150.0–400.0)
RBC: 4.32 Mil/uL (ref 3.87–5.11)
RDW: 13.2 % (ref 11.5–15.5)
WBC: 5.3 10*3/uL (ref 4.0–10.5)

## 2021-09-22 LAB — B12 AND FOLATE PANEL
Folate: 20.3 ng/mL (ref >5.9)
Vitamin B-12: 442 pg/mL (ref 211–911)

## 2021-09-22 LAB — BASIC METABOLIC PANEL
BUN: 11 mg/dL (ref 6–23)
CO2: 27 mEq/L (ref 19–32)
Calcium: 9.5 mg/dL (ref 8.4–10.5)
Chloride: 104 mEq/L (ref 96–112)
Creatinine, Ser: 0.6 mg/dL (ref 0.40–1.20)
GFR: 108.21 mL/min (ref >60.00)
Glucose, Bld: 104 mg/dL — ABNORMAL HIGH (ref 70–99)
Potassium: 3.9 mEq/L (ref 3.5–5.1)
Sodium: 138 mEq/L (ref 135–145)

## 2021-09-22 LAB — TSH: TSH: 1.6 u[IU]/mL (ref 0.35–5.50)

## 2021-09-22 MED ORDER — MELOXICAM 15 MG PO TABS: 15.0000 mg | ORAL_TABLET | Freq: Every day | ORAL | 0 refills | Status: DC

## 2021-09-22 MED ORDER — TRIAMCINOLONE ACETONIDE 0.1 % EX CREA: 1.0000 | TOPICAL_CREAM | Freq: Two times a day (BID) | CUTANEOUS | 0 refills | Status: DC

## 2021-09-22 NOTE — Patient Instructions (Signed)
Follow up as needed or as scheduled We'll notify you of your lab results and make any changes if needed We'll call you to schedule your hand appt START the Meloxicam once daily- take w/ food ICE your hand after work If you want to talk about hormone replacement, please call one of the GYN offices- [facility], [facility], [facility] Apply the Triamcinolone cream to your neck twice daily We'll call you with the dermatology referral- but this can take quite awhile Call with any questions or concerns Hang in there!!!

## 2021-09-22 NOTE — Progress Notes (Signed)
   Subjective:    Patient ID: Gloria Buckley, female    DOB: [birth year], 46 y.o.   MRN: [id number]  HPI Hand pain- pt reports L thumb will lock and is very painful.  Sxs started ~3 months ago but has worsened recently.  Also has R hand numbness- at tips of all fingers.  Denies burning or pain, 'it feels funny'.  Sxs will come and go.  No numbness on L.  Pt is R handed.  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  Some relief w/ Tylenol arthritis.    + thinning hair  Amenorrhea- pt reports no menses x2 yrs.  Decreased libido.  Pt was found to be menopausal last summer.  Skin lesion on back of neck- pt reports it has been present for multiple months.  Will occasionally itch.     Review of Systems For ROS see HPI     Objective:   Physical Exam Vitals reviewed.  Constitutional:      General: She is not in acute distress.    Appearance: Normal appearance. She is not ill-appearing.  HENT:     Head: Normocephalic and atraumatic.  Neck:     Comments: Normal thyroid Musculoskeletal:        General: Deformity (L 1st IP joint rigid, enlarged, painful) present.     Cervical back: Normal range of motion and neck supple. No tenderness.  Lymphadenopathy:     Cervical: No cervical adenopathy.  Skin:    General: Skin is warm and dry.     Findings: Lesion (hyperpigmented, eczematous appearing lesion at base of neck) present.  Neurological:     General: No focal deficit present.     Mental Status: She is alert and oriented to person, place, and time.     Sensory: No sensory deficit (no numbness or tingling w/ Phalen's).  Psychiatric:        Mood and Affect: Mood normal.        Behavior: Behavior normal.        Thought Content: Thought content normal.           Assessment & Plan:   Thumb pain- new.  Pt has very rigid, enlarged, painful L 1st IP joint.  Will start once daily Meloxicam and refer to hand specialist for complete evaluation and tx.  Pt expressed understanding  and is in agreement w/ plan.   Numbness and tingling of fingers- new.  Pt does a lot of repetitive work w/ her hands.  Holds drill in R hand.  Not able to reproduce sxs doing carpal tunnel maneuvers.  Will check labs to r/o B12 deficiency, anemia, thyroid abnormality.  Will refer to hand specialist for additional evaluation.  Pt expressed understanding and is in agreement w/ plan.   Thinning hair- new.  Not obvious on exam but pt notes increased loss of hair.  Check CBC to r/o anemia, check thyroid to r/o abnormality.  If labs WNL will encourage Biotin or OTC product like Nutrafol.  Skin lesion- new.  Area appears to be consistent w/ eczema.  Will start topical Triamcinolone cream BID and refer to Derm.  Pt expressed understanding and is in agreement w/ plan.

## 2022-02-21 ENCOUNTER — Encounter: Payer: Self-pay | Admitting: Family Medicine

## 2022-02-22 ENCOUNTER — Ambulatory Visit (INDEPENDENT_AMBULATORY_CARE_PROVIDER_SITE_OTHER): Payer: PRIVATE HEALTH INSURANCE | Admitting: Family Medicine

## 2022-02-22 ENCOUNTER — Encounter: Payer: Self-pay | Admitting: Family Medicine

## 2022-02-22 VITALS — BP 116/72 | HR 69 | Temp 97.6°F | Resp 17 | Ht 59.0 in | Wt 120.0 lb

## 2022-02-22 DIAGNOSIS — Z0000 Encounter for general adult medical examination without abnormal findings: Secondary | ICD-10-CM

## 2022-02-22 DIAGNOSIS — E559 Vitamin D deficiency, unspecified: Secondary | ICD-10-CM | POA: Diagnosis not present

## 2022-02-22 DIAGNOSIS — E785 Hyperlipidemia, unspecified: Secondary | ICD-10-CM | POA: Diagnosis not present

## 2022-02-22 LAB — LIPID PANEL
Cholesterol: 282 mg/dL — ABNORMAL HIGH (ref 0–200)
HDL: 59.6 mg/dL (ref >39.00)
NonHDL: 221.93
Total CHOL/HDL Ratio: 5
Triglycerides: 259 mg/dL — ABNORMAL HIGH (ref 0.0–149.0)
VLDL: 51.8 mg/dL — ABNORMAL HIGH (ref 0.0–40.0)

## 2022-02-22 LAB — CBC WITH DIFFERENTIAL/PLATELET
Basophils Absolute: 0 10*3/uL (ref 0.0–0.1)
Basophils Relative: 0.4 % (ref 0.0–3.0)
Eosinophils Absolute: 0.1 10*3/uL (ref 0.0–0.7)
Eosinophils Relative: 1.4 % (ref 0.0–5.0)
HCT: 38.6 % (ref 36.0–46.0)
Hemoglobin: 12.7 g/dL (ref 12.0–15.0)
Lymphocytes Relative: 32.9 % (ref 12.0–46.0)
Lymphs Abs: 2.5 10*3/uL (ref 0.7–4.0)
MCHC: 33 g/dL (ref 30.0–36.0)
MCV: 86.7 fl (ref 78.0–100.0)
Monocytes Absolute: 0.5 10*3/uL (ref 0.1–1.0)
Monocytes Relative: 6.4 % (ref 3.0–12.0)
Neutro Abs: 4.5 10*3/uL (ref 1.4–7.7)
Neutrophils Relative %: 58.9 % (ref 43.0–77.0)
Platelets: 277 10*3/uL (ref 150.0–400.0)
RBC: 4.45 Mil/uL (ref 3.87–5.11)
RDW: 13.2 % (ref 11.5–15.5)
WBC: 7.6 10*3/uL (ref 4.0–10.5)

## 2022-02-22 LAB — HEPATIC FUNCTION PANEL
ALT: 22 U/L (ref 0–35)
AST: 17 U/L (ref 0–37)
Albumin: 4.7 g/dL (ref 3.5–5.2)
Alkaline Phosphatase: 68 U/L (ref 39–117)
Bilirubin, Direct: 0.1 mg/dL (ref 0.0–0.3)
Total Bilirubin: 0.6 mg/dL (ref 0.2–1.2)
Total Protein: 8.6 g/dL — ABNORMAL HIGH (ref 6.0–8.3)

## 2022-02-22 LAB — BASIC METABOLIC PANEL
BUN: 10 mg/dL (ref 6–23)
CO2: 26 mEq/L (ref 19–32)
Calcium: 9.8 mg/dL (ref 8.4–10.5)
Chloride: 102 mEq/L (ref 96–112)
Creatinine, Ser: 0.6 mg/dL (ref 0.40–1.20)
GFR: 107.89 mL/min (ref >60.00)
Glucose, Bld: 97 mg/dL (ref 70–99)
Potassium: 4.4 mEq/L (ref 3.5–5.1)
Sodium: 138 mEq/L (ref 135–145)

## 2022-02-22 LAB — LDL CHOLESTEROL, DIRECT: Direct LDL: 181 mg/dL

## 2022-02-22 LAB — TSH: TSH: 3.39 u[IU]/mL (ref 0.35–5.50)

## 2022-02-22 LAB — VITAMIN D 25 HYDROXY (VIT D DEFICIENCY, FRACTURES): VITD: 17.4 ng/mL — ABNORMAL LOW (ref 30.00–100.00)

## 2022-02-22 NOTE — Assessment & Plan Note (Signed)
Pt's PE WNL.  UTD on pap, mammo, colonoscopy, Tdap.  Check labs.  Anticipatory guidance provided.  

## 2022-02-22 NOTE — Progress Notes (Signed)
   Subjective:    Patient ID: Gloria Buckley, female    DOB: [birth year], 47 y.o.   MRN: [id number]  HPI CPE- UTD on pap, mammo, Tdap, colonoscopy.  Patient Care Team    Relationship Specialty Notifications Start End  Midge Minium, MD PCP - General Family Medicine  12/16/13     Health Maintenance  Topic Date Due   INFLUENZA VACCINE  04/17/2022 (Originally 08/17/2021)   PAP SMEAR-Modifier  02/19/2024   DTaP/Tdap/Td (3 - Td or Tdap) 02/18/2026   COLONOSCOPY (Pts 45-65yrs Insurance coverage will need to be confirmed)  06/02/2031   Hepatitis C Screening  Completed   HIV Screening  Completed   HPV VACCINES  Aged Out   COVID-19 Vaccine  Discontinued      Review of Systems Patient reports no vision/ hearing changes, adenopathy,fever, weight change,  persistant/recurrent hoarseness , swallowing issues, chest pain, palpitations, edema, persistant/recurrent cough, hemoptysis, dyspnea (rest/exertional/paroxysmal nocturnal), gastrointestinal bleeding (melena, rectal bleeding), abdominal pain, significant heartburn, bowel changes, GU symptoms (dysuria, hematuria, incontinence), Gyn symptoms (abnormal  bleeding, pain),  syncope, focal weakness, memory loss, numbness & tingling, skin/hair/nail changes, abnormal bruising or bleeding, anxiety, or depression.     Objective:   Physical Exam General Appearance:    Alert, cooperative, no distress, appears stated age  Head:    Normocephalic, without obvious abnormality, atraumatic  Eyes:    PERRL, conjunctiva/corneas clear, EOM's intact both eyes  Ears:    Normal TM's and external ear canals, both ears  Nose:   Nares normal, septum midline, mucosa normal, no drainage    or sinus tenderness  Throat:   Lips, mucosa, and tongue normal; teeth and gums normal  Neck:   Supple, symmetrical, trachea midline, no adenopathy;    Thyroid: no enlargement/tenderness/nodules  Back:     Symmetric, no curvature, ROM normal, no CVA tenderness  Lungs:     Clear to  auscultation bilaterally, respirations unlabored  Chest Wall:    No tenderness or deformity   Heart:    Regular rate and rhythm, S1 and S2 normal, no murmur, rub   or gallop  Breast Exam:    Deferred to mammo  Abdomen:     Soft, non-tender, bowel sounds active all four quadrants,    no masses, no organomegaly  Genitalia:    Deferred  Rectal:    Extremities:   Extremities normal, atraumatic, no cyanosis or edema  Pulses:   2+ and symmetric all extremities  Skin:   Skin color, texture, turgor normal, no rashes or lesions  Lymph nodes:   Cervical, supraclavicular, and axillary nodes normal  Neurologic:   CNII-XII intact, normal strength, sensation and reflexes    throughout          Assessment & Plan:

## 2022-02-22 NOTE — Assessment & Plan Note (Signed)
Ongoing issue for pt.  She has refused medication in the past.  Check labs and determine if tx is needed.

## 2022-02-22 NOTE — Patient Instructions (Signed)
Follow up in 6 months to recheck cholesterol We'll notify you of your lab results and make any changes if needed Keep up the good work on healthy diet and regular exercise- you're doing great! Call with any questions or concerns Stay Safe!!  Stay Healthy!! 

## 2022-02-22 NOTE — Assessment & Plan Note (Signed)
Check labs and replete prn. 

## 2022-02-23 ENCOUNTER — Other Ambulatory Visit: Payer: Self-pay

## 2022-02-23 ENCOUNTER — Telehealth: Payer: Self-pay

## 2022-02-23 DIAGNOSIS — E559 Vitamin D deficiency, unspecified: Secondary | ICD-10-CM

## 2022-02-23 DIAGNOSIS — E785 Hyperlipidemia, unspecified: Secondary | ICD-10-CM

## 2022-02-23 MED ORDER — ROSUVASTATIN CALCIUM 20 MG PO TABS: 20.0000 mg | ORAL_TABLET | Freq: Every day | ORAL | 3 refills | Status: DC

## 2022-02-23 MED ORDER — VITAMIN D (ERGOCALCIFEROL) 1.25 MG (50000 UNIT) PO CAPS: 50000.0000 [IU] | ORAL_CAPSULE | ORAL | 12 refills | Status: DC

## 2022-02-23 NOTE — Telephone Encounter (Signed)
Informed pt of lab results and sent in Crestor 20 mg and Vit D 50,000 units . Liver function order is in and lab only visit has been made

## 2022-02-23 NOTE — Telephone Encounter (Signed)
-----   Message from Midge Minium, MD sent at 02/23/2022  7:36 AM EST ----- Vit D is low.  Based on this, we need to start 50,000 units weekly x12 weeks in addition to daily OTC supplement of at least 2000 units.   Total cholesterol, triglycerides (fatty part of blood), and LDL (bad cholesterol) are all much too high.  Based on this, we need to start Crestor 20mg  nightly, #30, 3 refills and repeat liver functions at a lab only visit in 6-8 weeks.  Remainder of labs look good!

## 2022-04-04 ENCOUNTER — Other Ambulatory Visit (INDEPENDENT_AMBULATORY_CARE_PROVIDER_SITE_OTHER): Payer: PRIVATE HEALTH INSURANCE

## 2022-04-04 DIAGNOSIS — E785 Hyperlipidemia, unspecified: Secondary | ICD-10-CM

## 2022-04-04 LAB — HEPATIC FUNCTION PANEL
ALT: 17 U/L (ref 0–35)
AST: 18 U/L (ref 0–37)
Albumin: 4.2 g/dL (ref 3.5–5.2)
Alkaline Phosphatase: 63 U/L (ref 39–117)
Bilirubin, Direct: 0.1 mg/dL (ref 0.0–0.3)
Total Bilirubin: 0.4 mg/dL (ref 0.2–1.2)
Total Protein: 7.8 g/dL (ref 6.0–8.3)

## 2022-04-05 ENCOUNTER — Telehealth: Payer: Self-pay

## 2022-04-05 NOTE — Telephone Encounter (Signed)
Informed pt of lab results  

## 2022-04-05 NOTE — Telephone Encounter (Signed)
-----   Message from Midge Minium, MD sent at 04/04/2022  3:00 PM EDT ----- Liver functions look great!  No changes at this time

## 2022-05-23 ENCOUNTER — Other Ambulatory Visit: Payer: Self-pay

## 2022-05-23 DIAGNOSIS — E785 Hyperlipidemia, unspecified: Secondary | ICD-10-CM

## 2022-05-23 MED ORDER — ROSUVASTATIN CALCIUM 20 MG PO TABS: 20.0000 mg | ORAL_TABLET | Freq: Every day | ORAL | 3 refills | Status: DC

## 2022-06-13 IMAGING — MG MM DIGITAL SCREENING BILAT W/ TOMO AND CAD
6 of 10 series · 6 of 30 positions shown · non-contrast
Comparison: Previous exam(s).

CLINICAL DATA: Screening.

EXAM:
DIGITAL SCREENING BILATERAL MAMMOGRAM WITH TOMOSYNTHESIS AND CAD
TECHNIQUE: Bilateral screening digital craniocaudal and mediolateral oblique
mammograms were obtained. Bilateral screening digital breast
tomosynthesis was performed. The images were evaluated with
computer-aided detection.

[R MLO synth-2D]
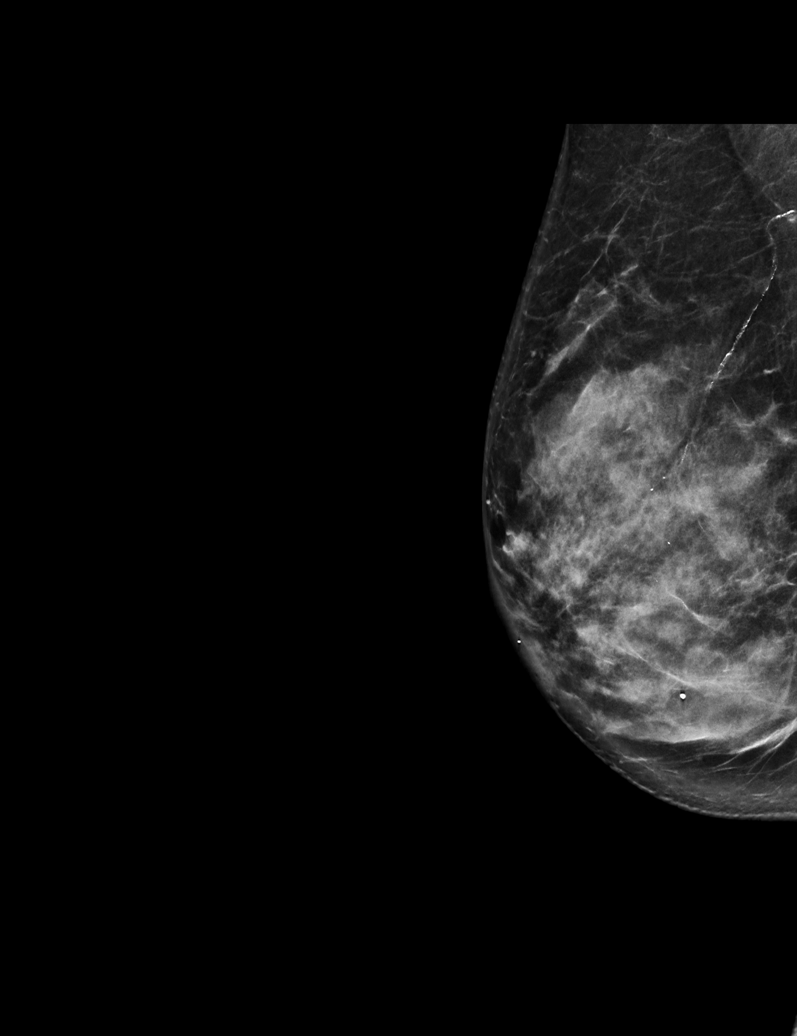

[L MLO synth-2D (1 of 2)]
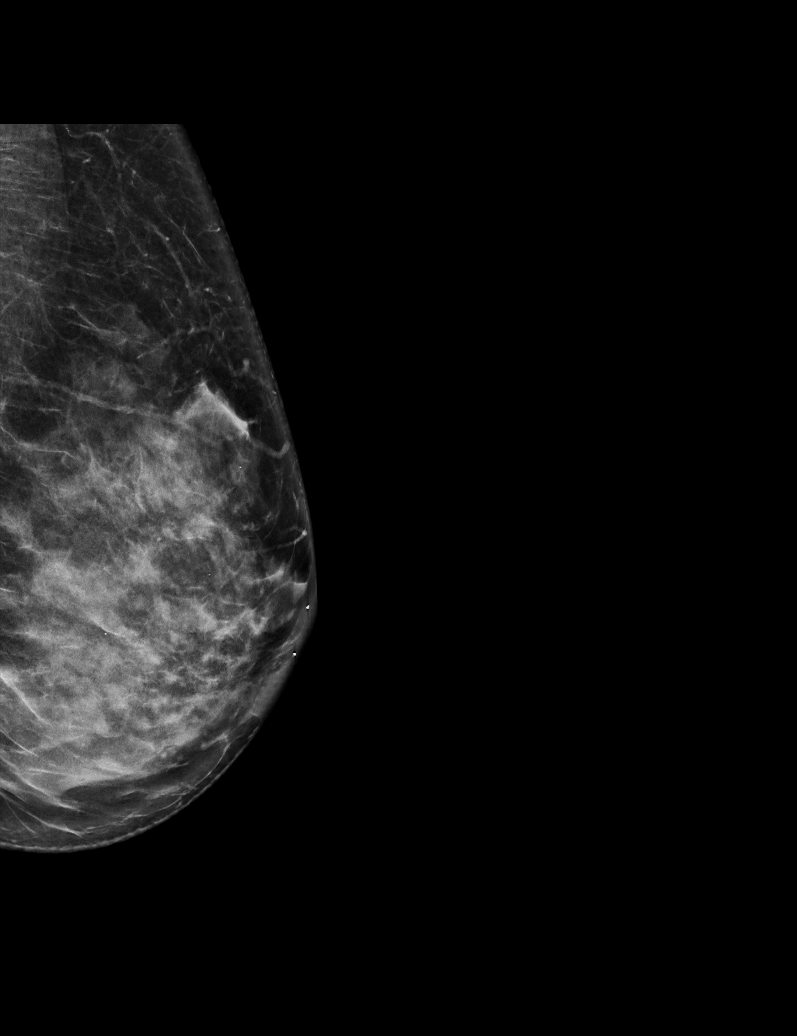

[L CC synth-2D]
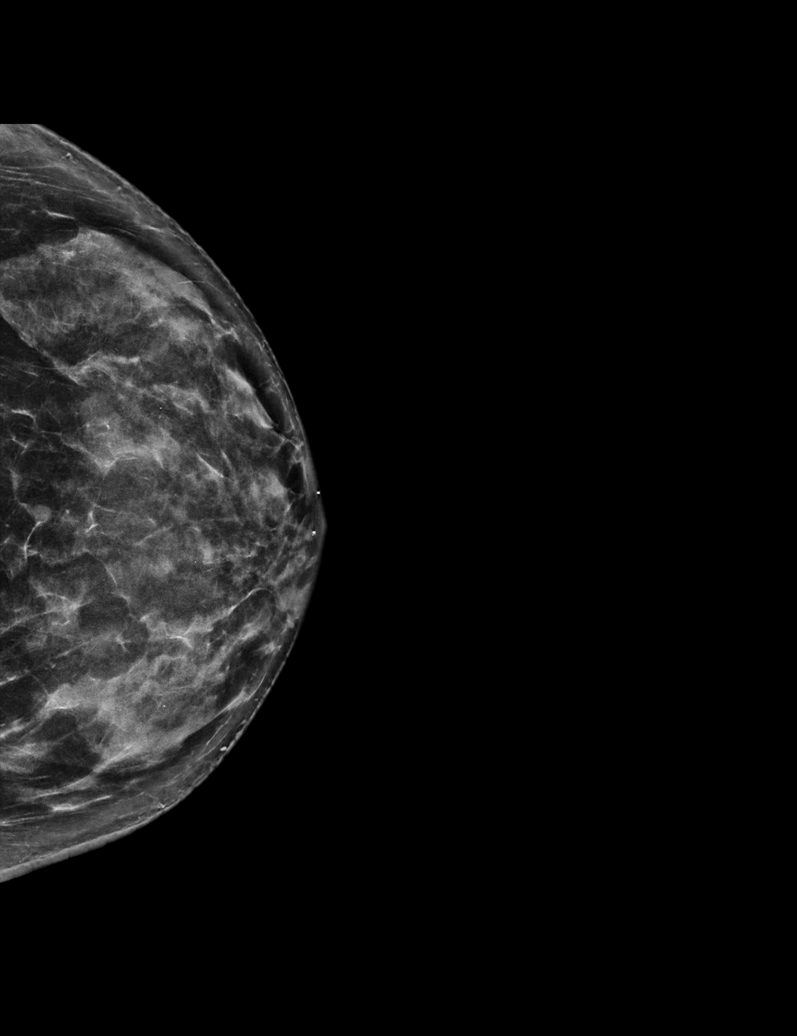

[R CC synth-2D]
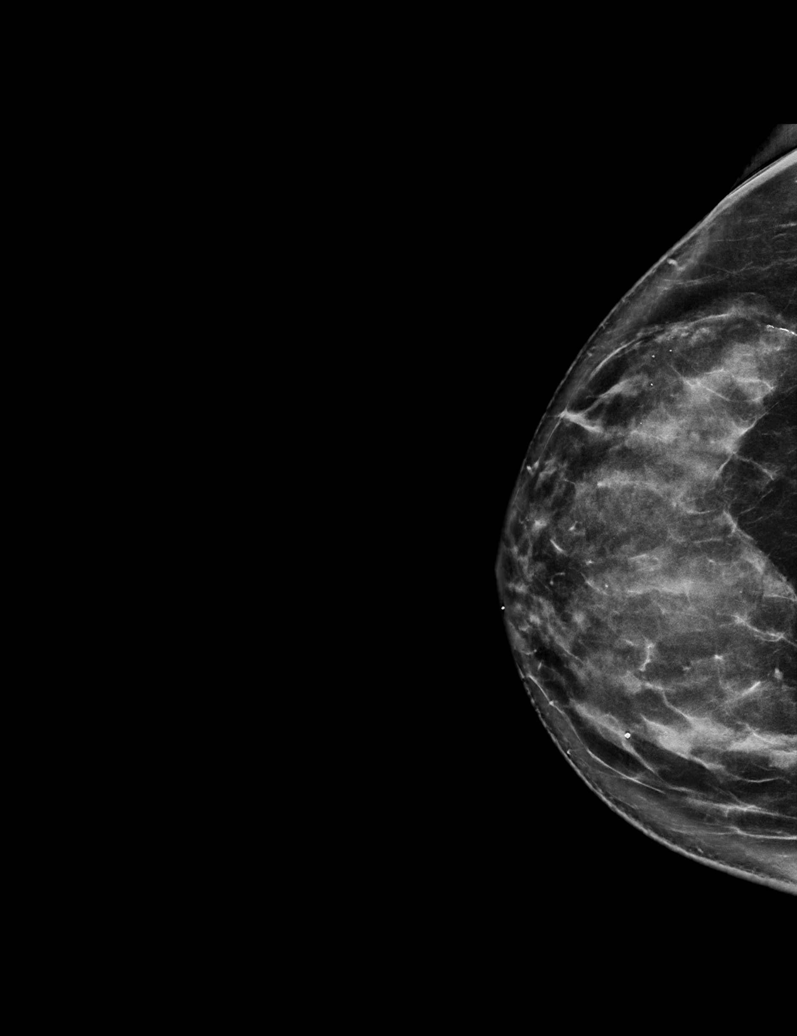

[L MLO synth-2D (2 of 2)]
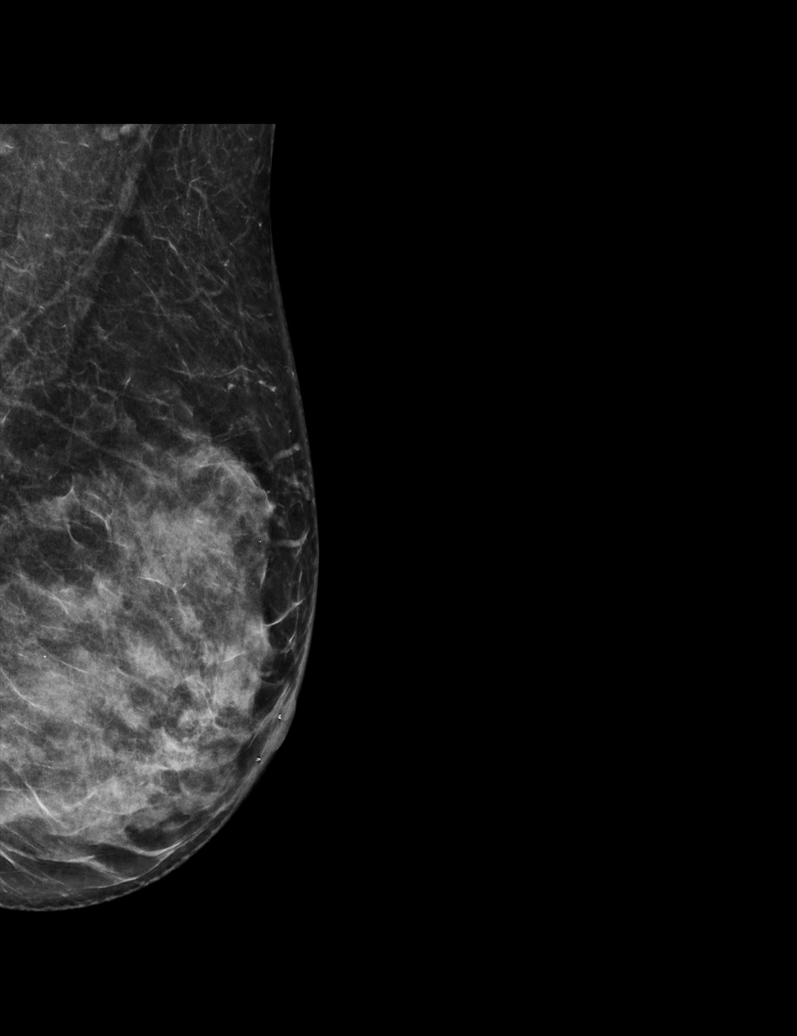

[R CC tomo · tomo slice 37/72.0]
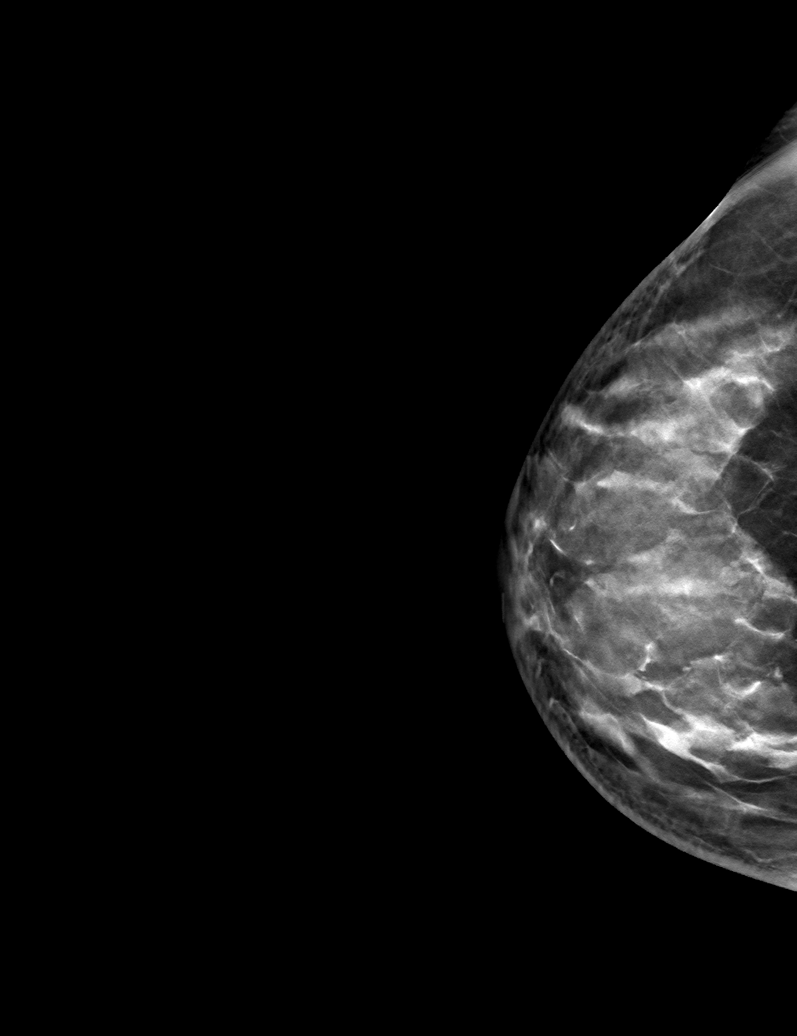

[6 of 30 positions shown; findings below may reference images not displayed]

ACR Breast Density Category d: The breast tissue is extremely dense,
which lowers the sensitivity of mammography
FINDINGS: There are no findings suspicious for malignancy.
IMPRESSION: No mammographic evidence of malignancy. A result letter of this
screening mammogram will be mailed directly to the patient.

RECOMMENDATION:
Screening mammogram in one year. (Code:[id number])

BI-RADS CATEGORY  1: Negative.

## 2022-08-29 ENCOUNTER — Ambulatory Visit: Payer: PRIVATE HEALTH INSURANCE | Admitting: Family Medicine

## 2022-09-05 ENCOUNTER — Ambulatory Visit: Payer: PRIVATE HEALTH INSURANCE | Admitting: Family Medicine

## 2022-09-06 ENCOUNTER — Ambulatory Visit (INDEPENDENT_AMBULATORY_CARE_PROVIDER_SITE_OTHER): Payer: 59 | Admitting: Family Medicine

## 2022-09-06 ENCOUNTER — Encounter: Payer: Self-pay | Admitting: Family Medicine

## 2022-09-06 VITALS — BP 104/72 | HR 100 | Temp 97.8°F | Resp 19 | Ht 59.0 in | Wt 120.1 lb

## 2022-09-06 DIAGNOSIS — Z1231 Encounter for screening mammogram for malignant neoplasm of breast: Secondary | ICD-10-CM

## 2022-09-06 DIAGNOSIS — E559 Vitamin D deficiency, unspecified: Secondary | ICD-10-CM

## 2022-09-06 DIAGNOSIS — L659 Nonscarring hair loss, unspecified: Secondary | ICD-10-CM

## 2022-09-06 DIAGNOSIS — E785 Hyperlipidemia, unspecified: Secondary | ICD-10-CM | POA: Diagnosis not present

## 2022-09-06 DIAGNOSIS — G4700 Insomnia, unspecified: Secondary | ICD-10-CM | POA: Diagnosis not present

## 2022-09-06 LAB — HEPATIC FUNCTION PANEL
ALT: 18 U/L (ref 0–35)
AST: 16 U/L (ref 0–37)
Albumin: 4.5 g/dL (ref 3.5–5.2)
Alkaline Phosphatase: 58 U/L (ref 39–117)
Bilirubin, Direct: 0.1 mg/dL (ref 0.0–0.3)
Total Bilirubin: 0.6 mg/dL (ref 0.2–1.2)
Total Protein: 7.8 g/dL (ref 6.0–8.3)

## 2022-09-06 LAB — VITAMIN D 25 HYDROXY (VIT D DEFICIENCY, FRACTURES): VITD: 37.15 ng/mL (ref 30.00–100.00)

## 2022-09-06 LAB — CBC WITH DIFFERENTIAL/PLATELET
Basophils Absolute: 0 10*3/uL (ref 0.0–0.1)
Basophils Relative: 0.5 % (ref 0.0–3.0)
Eosinophils Absolute: 0.2 10*3/uL (ref 0.0–0.7)
Eosinophils Relative: 3.9 % (ref 0.0–5.0)
HCT: 40.7 % (ref 36.0–46.0)
Hemoglobin: 13.1 g/dL (ref 12.0–15.0)
Lymphocytes Relative: 31.8 % (ref 12.0–46.0)
Lymphs Abs: 1.9 10*3/uL (ref 0.7–4.0)
MCHC: 32.3 g/dL (ref 30.0–36.0)
MCV: 88.4 fl (ref 78.0–100.0)
Monocytes Absolute: 0.4 10*3/uL (ref 0.1–1.0)
Monocytes Relative: 6.4 % (ref 3.0–12.0)
Neutro Abs: 3.5 10*3/uL (ref 1.4–7.7)
Neutrophils Relative %: 57.4 % (ref 43.0–77.0)
Platelets: 258 10*3/uL (ref 150.0–400.0)
RBC: 4.61 Mil/uL (ref 3.87–5.11)
RDW: 13 % (ref 11.5–15.5)
WBC: 6.1 10*3/uL (ref 4.0–10.5)

## 2022-09-06 LAB — LIPID PANEL
Cholesterol: 242 mg/dL — ABNORMAL HIGH (ref 0–200)
HDL: 73.4 mg/dL (ref >39.00)
LDL Cholesterol: 130 mg/dL — ABNORMAL HIGH (ref 0–99)
NonHDL: 168.45
Total CHOL/HDL Ratio: 3
Triglycerides: 193 mg/dL — ABNORMAL HIGH (ref 0.0–149.0)
VLDL: 38.6 mg/dL (ref 0.0–40.0)

## 2022-09-06 LAB — BASIC METABOLIC PANEL
BUN: 14 mg/dL (ref 6–23)
CO2: 30 mEq/L (ref 19–32)
Calcium: 9.9 mg/dL (ref 8.4–10.5)
Chloride: 100 mEq/L (ref 96–112)
Creatinine, Ser: 0.55 mg/dL (ref 0.40–1.20)
GFR: 109.76 mL/min (ref >60.00)
Glucose, Bld: 93 mg/dL (ref 70–99)
Potassium: 4 mEq/L (ref 3.5–5.1)
Sodium: 139 mEq/L (ref 135–145)

## 2022-09-06 LAB — TSH: TSH: 1.56 u[IU]/mL (ref 0.35–5.50)

## 2022-09-06 MED ORDER — TRAZODONE HCL 50 MG PO TABS: 25.0000 mg | ORAL_TABLET | Freq: Every evening | ORAL | 3 refills | Status: DC | PRN

## 2022-09-06 NOTE — Patient Instructions (Signed)
Schedule your complete physical in 6 months We'll notify you of your lab results and make any changes if needed We'll call you to schedule your mammogram START the Trazodone nightly for sleep.  Start w/ 1/2 tab nightly and increase to 1 tab if needed Call with any questions or concerns Stay Safe!  Stay Healthy! Enjoy the rest of your summer!

## 2022-09-06 NOTE — Assessment & Plan Note (Signed)
Chronic problem.  On Crestor 20mg daily w/o difficulty.  Check labs.  Adjust meds prn  

## 2022-09-06 NOTE — Assessment & Plan Note (Signed)
New.  Pt reports she is having issues falling asleep rather than staying asleep.  Feels that some night she is unable to turn off her brain.  Will try Trazodone and monitor for improvement.  Pt expressed understanding and is in agreement w/ plan.

## 2022-09-06 NOTE — Assessment & Plan Note (Signed)
Check labs and replete prn. 

## 2022-09-06 NOTE — Progress Notes (Signed)
   Subjective:    Patient ID: Gloria Buckley, female    DOB: 07/15/1975, 47 y.o.   MRN: [id number]  HPI Hyperlipidemia- chronic problem, on Crestor 20mg  daily.  No CP, SOB, abd pain, N/V.  Hair loss- pt is concerned about thyroid but knows it could be age related.  Sxs started ~1 yr ago.  Insomnia- pt reports trouble falling asleep.  Not every night.  'sometimes when I close my eyes my brain is still going'.  Pt is interested in trying medication to help w/ sleep.   Review of Systems For ROS see HPI     Objective:   Physical Exam Vitals reviewed.  Constitutional:      General: She is not in acute distress.    Appearance: Normal appearance. She is well-developed. She is not ill-appearing.  HENT:     Head: Normocephalic and atraumatic.  Eyes:     Conjunctiva/sclera: Conjunctivae normal.     Pupils: Pupils are equal, round, and reactive to light.  Neck:     Thyroid: No thyromegaly.  Cardiovascular:     Rate and Rhythm: Normal rate and regular rhythm.     Pulses: Normal pulses.     Heart sounds: Normal heart sounds. No murmur heard. Pulmonary:     Effort: Pulmonary effort is normal. No respiratory distress.     Breath sounds: Normal breath sounds.  Abdominal:     General: There is no distension.     Palpations: Abdomen is soft.     Tenderness: There is no abdominal tenderness.  Musculoskeletal:     Cervical back: Normal range of motion and neck supple.     Right lower leg: No edema.     Left lower leg: No edema.  Lymphadenopathy:     Cervical: No cervical adenopathy.  Skin:    General: Skin is warm and dry.  Neurological:     General: No focal deficit present.     Mental Status: She is alert and oriented to person, place, and time.  Psychiatric:        Mood and Affect: Mood normal.        Behavior: Behavior normal.           Assessment & Plan:   Thinning hair- pt has noticed increased hair loss for the past year.  She was encouraged by her hair dresser to check  her thyroid.  She also knows this can be age related and hereditary.  Check labs and treat any abnormalities if present.

## 2022-09-07 ENCOUNTER — Telehealth: Payer: Self-pay

## 2022-09-07 NOTE — Telephone Encounter (Signed)
-----   Message from Neena Rhymes sent at 09/07/2022  7:42 AM EDT ----- Labs look good!  Total cholesterol and LDL (bad cholesterol) are still above goal but are SO much better since starting the Crestor.  No med changes at this time  Thyroid is normal

## 2022-09-07 NOTE — Telephone Encounter (Signed)
Pt is aware of the lab results  °

## 2022-09-20 ENCOUNTER — Other Ambulatory Visit: Payer: Self-pay

## 2022-09-20 DIAGNOSIS — E785 Hyperlipidemia, unspecified: Secondary | ICD-10-CM

## 2022-09-20 MED ORDER — ROSUVASTATIN CALCIUM 20 MG PO TABS: 20.0000 mg | ORAL_TABLET | Freq: Every day | ORAL | 3 refills | Status: DC

## 2022-10-04 LAB — HM MAMMOGRAPHY

## 2022-10-06 ENCOUNTER — Encounter: Payer: Self-pay | Admitting: Family Medicine

## 2023-03-15 ENCOUNTER — Encounter: Payer: PRIVATE HEALTH INSURANCE | Admitting: Family Medicine

## 2023-04-03 ENCOUNTER — Ambulatory Visit (INDEPENDENT_AMBULATORY_CARE_PROVIDER_SITE_OTHER): Payer: PRIVATE HEALTH INSURANCE | Admitting: Family Medicine

## 2023-04-03 ENCOUNTER — Encounter: Payer: Self-pay | Admitting: Family Medicine

## 2023-04-03 VITALS — BP 102/68 | HR 68 | Temp 98.2°F | Wt 123.0 lb

## 2023-04-03 DIAGNOSIS — E559 Vitamin D deficiency, unspecified: Secondary | ICD-10-CM | POA: Diagnosis not present

## 2023-04-03 DIAGNOSIS — E785 Hyperlipidemia, unspecified: Secondary | ICD-10-CM | POA: Diagnosis not present

## 2023-04-03 DIAGNOSIS — Z0000 Encounter for general adult medical examination without abnormal findings: Secondary | ICD-10-CM

## 2023-04-03 DIAGNOSIS — R202 Paresthesia of skin: Secondary | ICD-10-CM

## 2023-04-03 DIAGNOSIS — M79645 Pain in left finger(s): Secondary | ICD-10-CM

## 2023-04-03 DIAGNOSIS — R200 Anesthesia of skin: Secondary | ICD-10-CM

## 2023-04-03 NOTE — Progress Notes (Signed)
   Subjective:    Patient ID: Gloria Buckley, female    DOB: 12-Feb-1975, 48 y.o.   MRN: [id number]  HPI CPE- UTD on pap, Tdap, colonoscopy.  Pt reports feeling good.  Patient Care Team    Relationship Specialty Notifications Start End  Sheliah Hatch, MD PCP - General Family Medicine  12/16/13     Health Maintenance  Topic Date Due   INFLUENZA VACCINE  Never done   COVID-19 Vaccine (2 - 2024-25 season) 09/18/2022   Cervical Cancer Screening (HPV/Pap Cotest)  02/18/2026   DTaP/Tdap/Td (3 - Td or Tdap) 02/18/2026   Colonoscopy  06/02/2031   Hepatitis C Screening  Completed   HIV Screening  Completed   HPV VACCINES  Aged Out    Patient Care Team    Relationship Specialty Notifications Start End  Sheliah Hatch, MD PCP - General Family Medicine  12/16/13       Review of Systems Patient reports no vision/ hearing changes, adenopathy,fever, weight change,  persistant/recurrent hoarseness , swallowing issues, chest pain, palpitations, edema, persistant/recurrent cough, hemoptysis, dyspnea (rest/exertional/paroxysmal nocturnal), gastrointestinal bleeding (melena, rectal bleeding), abdominal pain, significant heartburn, bowel changes, GU symptoms (dysuria, hematuria, incontinence), Gyn symptoms (abnormal  bleeding, pain),  syncope, focal weakness, memory loss, skin/hair/nail changes, abnormal bruising or bleeding, anxiety, or depression.   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    Objective:   Physical Exam General Appearance:    Alert, cooperative, no distress, appears stated age  Head:    Normocephalic, without obvious abnormality, atraumatic  Eyes:    PERRL, conjunctiva/corneas clear, EOM's intact both eyes  Ears:    Normal TM's and external ear canals, both ears  Nose:   Nares normal, septum midline, mucosa normal, no drainage    or sinus tenderness  Throat:   Lips, mucosa, and tongue normal; teeth and gums normal  Neck:   Supple,  symmetrical, trachea midline, no adenopathy;    Thyroid: no enlargement/tenderness/nodules  Back:     Symmetric, no curvature, ROM normal, no CVA tenderness  Lungs:     Clear to auscultation bilaterally, respirations unlabored  Chest Wall:    No tenderness or deformity   Heart:    Regular rate and rhythm, S1 and S2 normal, no murmur, rub   or gallop  Breast Exam:    Deferred to mammo  Abdomen:     Soft, non-tender, bowel sounds active all four quadrants,    no masses, no organomegaly  Genitalia:    Deferred  Rectal:    Extremities:   Extremities normal, atraumatic, no cyanosis or edema  Pulses:   2+ and symmetric all extremities  Skin:   Skin color, texture, turgor normal, no rashes or lesions  Lymph nodes:   Cervical, supraclavicular, and axillary nodes normal  Neurologic:   CNII-XII intact, normal strength, sensation and reflexes    throughout          Assessment & Plan:

## 2023-04-03 NOTE — Assessment & Plan Note (Signed)
 Pt is not currently taking statin.  Check labs and determine if this needs to be restarted.

## 2023-04-03 NOTE — Patient Instructions (Signed)
 Follow up in 1 year or as needed We'll notify you of your lab results and make any changes if needed Keep up the good work on healthy diet and regular exercise- you look great!!! We'll call you about your Hand referral Call with any questions or concerns Stay Safe!  Stay Healthy! Happy Spring!!!

## 2023-04-03 NOTE — Assessment & Plan Note (Signed)
Pt's PE WNL.  UTD on pap, mammo, colonoscopy, Tdap.  Check labs.  Anticipatory guidance provided.  

## 2023-04-04 LAB — CBC WITH DIFFERENTIAL/PLATELET
Basophils Absolute: 0 10*3/uL (ref 0.0–0.1)
Basophils Relative: 0.5 % (ref 0.0–3.0)
Eosinophils Absolute: 0.1 10*3/uL (ref 0.0–0.7)
Eosinophils Relative: 1.3 % (ref 0.0–5.0)
HCT: 38.4 % (ref 36.0–46.0)
Hemoglobin: 12.8 g/dL (ref 12.0–15.0)
Lymphocytes Relative: 34.7 % (ref 12.0–46.0)
Lymphs Abs: 2.5 10*3/uL (ref 0.7–4.0)
MCHC: 33.4 g/dL (ref 30.0–36.0)
MCV: 88.1 fl (ref 78.0–100.0)
Monocytes Absolute: 0.7 10*3/uL (ref 0.1–1.0)
Monocytes Relative: 9.3 % (ref 3.0–12.0)
Neutro Abs: 3.9 10*3/uL (ref 1.4–7.7)
Neutrophils Relative %: 54.2 % (ref 43.0–77.0)
Platelets: 273 10*3/uL (ref 150.0–400.0)
RBC: 4.36 Mil/uL (ref 3.87–5.11)
RDW: 13 % (ref 11.5–15.5)
WBC: 7.1 10*3/uL (ref 4.0–10.5)

## 2023-04-04 LAB — BASIC METABOLIC PANEL
BUN: 7 mg/dL (ref 6–23)
CO2: 31 meq/L (ref 19–32)
Calcium: 9.6 mg/dL (ref 8.4–10.5)
Chloride: 102 meq/L (ref 96–112)
Creatinine, Ser: 0.57 mg/dL (ref 0.40–1.20)
GFR: 108.38 mL/min (ref >60.00)
Glucose, Bld: 97 mg/dL (ref 70–99)
Potassium: 4.2 meq/L (ref 3.5–5.1)
Sodium: 140 meq/L (ref 135–145)

## 2023-04-04 LAB — LIPID PANEL
Cholesterol: 251 mg/dL — ABNORMAL HIGH (ref 0–200)
HDL: 56.4 mg/dL (ref >39.00)
LDL Cholesterol: 128 mg/dL — ABNORMAL HIGH (ref 0–99)
NonHDL: 194.78
Total CHOL/HDL Ratio: 4
Triglycerides: 333 mg/dL — ABNORMAL HIGH (ref 0.0–149.0)
VLDL: 66.6 mg/dL — ABNORMAL HIGH (ref 0.0–40.0)

## 2023-04-04 LAB — HEPATIC FUNCTION PANEL
ALT: 22 U/L (ref 0–35)
AST: 17 U/L (ref 0–37)
Albumin: 4.5 g/dL (ref 3.5–5.2)
Alkaline Phosphatase: 66 U/L (ref 39–117)
Bilirubin, Direct: 0 mg/dL (ref 0.0–0.3)
Total Bilirubin: 0.3 mg/dL (ref 0.2–1.2)
Total Protein: 8 g/dL (ref 6.0–8.3)

## 2023-04-04 LAB — VITAMIN D 25 HYDROXY (VIT D DEFICIENCY, FRACTURES): VITD: 39.33 ng/mL (ref 30.00–100.00)

## 2023-04-04 LAB — TSH: TSH: 1 u[IU]/mL (ref 0.35–5.50)

## 2023-04-05 ENCOUNTER — Encounter: Payer: Self-pay | Admitting: Family Medicine

## 2023-04-06 MED ORDER — ROSUVASTATIN CALCIUM 20 MG PO TABS: 20.0000 mg | ORAL_TABLET | Freq: Every day | ORAL | 1 refills | Status: DC

## 2023-04-06 NOTE — Addendum Note (Signed)
 Addended by: Cordella Register on: 04/06/2023 04:37 PM   Modules accepted: Orders

## 2023-04-25 ENCOUNTER — Other Ambulatory Visit: Payer: Self-pay | Admitting: Family Medicine

## 2023-04-25 DIAGNOSIS — E559 Vitamin D deficiency, unspecified: Secondary | ICD-10-CM

## 2023-04-25 NOTE — Telephone Encounter (Signed)
 Copied from CRM [id number]. Topic: Clinical - Medication Refill >> Apr 25, 2023  9:36 AM Sonny Dandy B wrote: Most Recent Primary Care Visit:  Provider: Sheliah Hatch  Department: [facility]  Visit Type: PHYSICAL  Date: 04/03/2023 Medication: rosuvastatin (CRESTOR) 20 MG tablet, Vitamin D, Ergocalciferol, (DRISDOL) 1.25 MG (50000 UNIT) CAPS capsule  Has the patient contacted their pharmacy? Yes (Agent: If no, request that the patient contact the pharmacy for the refill. If patient does not wish to contact the pharmacy document the reason why and proceed with request.) (Agent: If yes, when and what did the pharmacy advise?)  Is this the correct pharmacy for this prescription? Yes If no, delete pharmacy and type the correct one.  This is the patient's preferred pharmacy:  [facility] [id number], [place] - [address] [place] [address] Phone: [phone] Fax: [phone]   Has the prescription been filled recently? Yes  Is the patient out of the medication? Yes  Has the patient been seen for an appointment in the last year OR does the patient have an upcoming appointment? Yes  Can we respond through MyChart? Yes  Agent: Please be advised that Rx refills may take up to 3 business days. We ask that you follow-up with your pharmacy.

## 2023-04-25 NOTE — Telephone Encounter (Signed)
 Last Fill: Rosuvastatin: 04/06/23 90 tabs/1 RF     Vitamin D: 02/23/22 7 caps/ 12 RF  Last OV: 04/03/23 Next OV: 04/08/24  Routing to provider for review/authorization.

## 2023-07-17 ENCOUNTER — Other Ambulatory Visit: Payer: Self-pay | Admitting: Family Medicine

## 2023-07-17 NOTE — Telephone Encounter (Unsigned)
 Copied from CRM [id number]. Topic: Clinical - Medication Refill >> Jul 17, 2023 10:03 AM Shereese L wrote: Medication: Vitamin D , Ergocalciferol , (DRISDOL ) 1.25 MG (50000 UNIT) CAPS capsule  Has the patient contacted their pharmacy? Yes (Agent: If no, request that the patient contact the pharmacy for the refill. If patient does not wish to contact the pharmacy document the reason why and proceed with request.) (Agent: If yes, when and what did the pharmacy advise?)  This is the patient's preferred pharmacy:  [facility] [id number] [place], [place] - [address] [place] [address] Phone: [phone] Fax: [phone]  Is this the correct pharmacy for this prescription? Yes If no, delete pharmacy and type the correct one.   Has the prescription been filled recently? Yes  Is the patient out of the medication? Yes  Has the patient been seen for an appointment in the last year OR does the patient have an upcoming appointment? Yes  Can we respond through MyChart? Yes  Agent: Please be advised that Rx refills may take up to 3 business days. We ask that you follow-up with your pharmacy.

## 2023-10-10 ENCOUNTER — Ambulatory Visit: Payer: Self-pay

## 2023-10-10 NOTE — Telephone Encounter (Signed)
 FYI Only or Action Required?: FYI only for provider.  Patient was last seen in primary care on 04/03/2023 by Mahlon Comer BRAVO, MD.  Called Nurse Triage reporting Shortness of Breath, Dizziness, and Fatigue.  Symptoms began couple weeks ago.  Interventions attempted: Nothing.  Symptoms are: SOB/fatigue/dizziness with exertion; right shoulder pain with right hand/finger numbness gradually worsening.  Triage Disposition: See Physician Within 24 Hours (overriding See HCP Within 4 Hours (Or PCP Triage))  Patient/caregiver understands and will follow disposition?: Yes                Copied from CRM [id number]. Topic: Clinical - Red Word Triage >> Oct 10, 2023 10:25 AM Carlyon D wrote: Red Word that prompted transfer to Nurse Triage: short of breath, very tired, dizzy/fatigue, body is in pain Reason for Disposition  [1] MILD difficulty breathing (e.g., minimal/no SOB at rest, SOB with walking, pulse < 100) AND [2] NEW-onset or WORSE than normal  Answer Assessment - Initial Assessment Questions 1. RESPIRATORY STATUS: Describe your breathing? (e.g., wheezing, shortness of breath, unable to speak, severe coughing)      Shortness of breath, she states she feels it sometimes when she lies down at night and mostly when she exerts herself.  2. ONSET: When did this breathing problem begin?      Couple weeks ago.  3. PATTERN Does the difficult breathing come and go, or has it been constant since it started?      Comes and goes.She states it sometimes only occurs once a week. Not present now.  4. SEVERITY: How bad is your breathing? (e.g., mild, moderate, severe)      Not present now but mild SOB with exertion.  5. RECURRENT SYMPTOM: Have you had difficulty breathing before? If Yes, ask: When was the last time? and What happened that time?      No.  6. CARDIAC HISTORY: Do you have any history of heart disease? (e.g., heart attack, angina, bypass surgery, angioplasty)       No.  7. LUNG HISTORY: Do you have any history of lung disease?  (e.g., pulmonary embolus, asthma, emphysema)     No.  8. CAUSE: What do you think is causing the breathing problem?      She thinks it could be low iron or cholesterol could be high. She states she ran out of her cholesterol medication.  9. OTHER SYMPTOMS: Do you have any other symptoms? (e.g., chest pain, cough, dizziness, fever, runny nose)     Fatigue when exerting herself or at work (if she works really hard), she states she will also feel dizziness sometimes with the tiredness. Right shoulder pain with intermittent right hand/finger numbness. Denies chest pain, fever, cough, runny nose, sore throat, weakness or numbness elsewhere in her body.  10. O2 SATURATION MONITOR:  Do you use an oxygen saturation monitor (pulse oximeter) at home? If Yes, ask: What is your reading (oxygen level) today? What is your usual oxygen saturation reading? (e.g., 95%)       No.  11. PREGNANCY: Is there any chance you are pregnant? When was your last menstrual period?       N/A.  12. TRAVEL: Have you traveled out of the country in the last month? (e.g., travel history, exposures)       No.  Protocols used: Breathing Difficulty-A-AH

## 2023-10-11 ENCOUNTER — Encounter: Payer: Self-pay | Admitting: Family Medicine

## 2023-10-11 ENCOUNTER — Ambulatory Visit (INDEPENDENT_AMBULATORY_CARE_PROVIDER_SITE_OTHER): Admitting: Family Medicine

## 2023-10-11 ENCOUNTER — Other Ambulatory Visit

## 2023-10-11 VITALS — BP 120/78 | HR 75 | Temp 97.9°F | Wt 124.0 lb

## 2023-10-11 DIAGNOSIS — R200 Anesthesia of skin: Secondary | ICD-10-CM | POA: Diagnosis not present

## 2023-10-11 DIAGNOSIS — Z0000 Encounter for general adult medical examination without abnormal findings: Secondary | ICD-10-CM

## 2023-10-11 DIAGNOSIS — R202 Paresthesia of skin: Secondary | ICD-10-CM

## 2023-10-11 DIAGNOSIS — R0602 Shortness of breath: Secondary | ICD-10-CM

## 2023-10-11 DIAGNOSIS — R5383 Other fatigue: Secondary | ICD-10-CM | POA: Diagnosis not present

## 2023-10-11 LAB — CBC WITH DIFFERENTIAL/PLATELET
Basophils Absolute: 0 K/uL (ref 0.0–0.1)
Basophils Relative: 0.6 % (ref 0.0–3.0)
Eosinophils Absolute: 0.1 K/uL (ref 0.0–0.7)
Eosinophils Relative: 1.8 % (ref 0.0–5.0)
HCT: 37.1 % (ref 36.0–46.0)
Hemoglobin: 12.4 g/dL (ref 12.0–15.0)
Lymphocytes Relative: 39.4 % (ref 12.0–46.0)
Lymphs Abs: 2.5 K/uL (ref 0.7–4.0)
MCHC: 33.3 g/dL (ref 30.0–36.0)
MCV: 85.7 fl (ref 78.0–100.0)
Monocytes Absolute: 0.3 K/uL (ref 0.1–1.0)
Monocytes Relative: 5.6 % (ref 3.0–12.0)
Neutro Abs: 3.3 K/uL (ref 1.4–7.7)
Neutrophils Relative %: 52.6 % (ref 43.0–77.0)
Platelets: 254 K/uL (ref 150.0–400.0)
RBC: 4.33 Mil/uL (ref 3.87–5.11)
RDW: 13.4 % (ref 11.5–15.5)
WBC: 6.3 K/uL (ref 4.0–10.5)

## 2023-10-11 LAB — HEPATIC FUNCTION PANEL
ALT: 36 U/L — ABNORMAL HIGH (ref 0–35)
AST: 26 U/L (ref 0–37)
Albumin: 4.6 g/dL (ref 3.5–5.2)
Alkaline Phosphatase: 52 U/L (ref 39–117)
Bilirubin, Direct: 0.1 mg/dL (ref 0.0–0.3)
Total Bilirubin: 0.5 mg/dL (ref 0.2–1.2)
Total Protein: 7.8 g/dL (ref 6.0–8.3)

## 2023-10-11 LAB — BASIC METABOLIC PANEL WITH GFR
BUN: 8 mg/dL (ref 6–23)
CO2: 29 meq/L (ref 19–32)
Calcium: 9.6 mg/dL (ref 8.4–10.5)
Chloride: 103 meq/L (ref 96–112)
Creatinine, Ser: 0.52 mg/dL (ref 0.40–1.20)
GFR: 110.4 mL/min (ref >60.00)
Glucose, Bld: 132 mg/dL — ABNORMAL HIGH (ref 70–99)
Potassium: 3.6 meq/L (ref 3.5–5.1)
Sodium: 140 meq/L (ref 135–145)

## 2023-10-11 LAB — LIPID PANEL
Cholesterol: 298 mg/dL — ABNORMAL HIGH (ref 0–200)
HDL: 62.2 mg/dL (ref >39.00)
LDL Cholesterol: 181 mg/dL — ABNORMAL HIGH (ref 0–99)
NonHDL: 235.64
Total CHOL/HDL Ratio: 5
Triglycerides: 272 mg/dL — ABNORMAL HIGH (ref 0.0–149.0)
VLDL: 54.4 mg/dL — ABNORMAL HIGH (ref 0.0–40.0)

## 2023-10-11 LAB — VITAMIN D 25 HYDROXY (VIT D DEFICIENCY, FRACTURES): VITD: 34.82 ng/mL (ref 30.00–100.00)

## 2023-10-11 LAB — TSH: TSH: 1.18 u[IU]/mL (ref 0.35–5.50)

## 2023-10-11 LAB — B12 AND FOLATE PANEL
Folate: 15.8 ng/mL (ref >5.9)
Vitamin B-12: 546 pg/mL (ref 211–911)

## 2023-10-11 MED ORDER — FAMOTIDINE 40 MG PO TABS: 40.0000 mg | ORAL_TABLET | Freq: Every day | ORAL | 3 refills | Status: AC

## 2023-10-11 MED ORDER — ROSUVASTATIN CALCIUM 20 MG PO TABS: 20.0000 mg | ORAL_TABLET | Freq: Every day | ORAL | 1 refills | Status: AC

## 2023-10-11 NOTE — Patient Instructions (Signed)
 Follow up as needed or as scheduled We'll notify you of your lab results and make any changes if needed We'll call you to schedule your hand appt Try and eat regularly- but slowly START the Famotidine  once daily to decrease acid production Drink LOTS of water during your busy days to help w/ dizziness Call with any questions or concerns- particularly if things aren't improving Stay Safe!  Stay Healthy! Hang in there!!

## 2023-10-11 NOTE — Progress Notes (Signed)
 Subjective:    Patient ID: Gloria Buckley, female    DOB: [birth year], 48 y.o.   MRN: [id number]  HPI SOB- pt reports recent SOB.  Will last a few minutes when it occurs.  Not daily.  No particular time of day.  Pt reports sxs improved last night when she was able to burp.  Denies heartburn.  Pt feels sxs are related to eating- particularly if eating fast or a lot.  Fatigue- pt reports she is getting more tired due to increased work.  Finds that if she gets up too fast, she will get dizzy.  States she is drinking water during the day but not always eating.    R hand numbness- develops numbness of finger tips while at work.  Does a lot of repetitive motion w/ hand.  Has hand and wrist pain.  At times feels swollen.     Review of Systems For ROS see HPI     Objective:   Physical Exam Vitals reviewed.  Constitutional:      General: She is not in acute distress.    Appearance: She is well-developed.  HENT:     Head: Normocephalic and atraumatic.  Eyes:     Conjunctiva/sclera: Conjunctivae normal.     Pupils: Pupils are equal, round, and reactive to light.  Neck:     Thyroid : No thyromegaly.  Cardiovascular:     Rate and Rhythm: Normal rate and regular rhythm.     Heart sounds: Normal heart sounds. No murmur heard. Pulmonary:     Effort: Pulmonary effort is normal. No respiratory distress.     Breath sounds: Normal breath sounds.  Abdominal:     General: There is no distension.     Palpations: Abdomen is soft.     Tenderness: There is no abdominal tenderness.  Musculoskeletal:        General: Tenderness (TTP over R carpal tunnel) present. No deformity.     Cervical back: Normal range of motion and neck supple.  Lymphadenopathy:     Cervical: No cervical adenopathy.  Skin:    General: Skin is warm and dry.  Neurological:     General: No focal deficit present.     Mental Status: She is alert and oriented to person, place, and time.  Psychiatric:        Mood and Affect: Mood  normal.        Behavior: Behavior normal.        Thought Content: Thought content normal.           Assessment & Plan:  SOB- new.  At first pt reported there was no pattern but she then admitted it was related to eating- particularly eating too fast or too much.  Sxs improve w/ belching.  Encouraged her to eat more slowly, take smaller bites, and will start H2 blocker in case of silent reflux and esophageal spasm.  Pt expressed understanding and is in agreement w/ plan.   Fatigue- new.  Pt reports working very long hours.  Not eating regularly and trying to avoid drinking water so she doesn't have to go the bathroom.  This would account for her dizziness upon standing.  Stressed the need to eat regularly and increase water intake.  Will check labs to assess for underlying cause.  R hand numbness/tingling- new.  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  Will use a dremmel multiple times each day and finds that w/ prolonged, repetitive motion her hand and  finger tips will go numb and be painful.  Suspect carpal tunnel.  Refer to hand specialist.  Pt expressed understanding and is in agreement w/ plan.

## 2023-10-12 ENCOUNTER — Ambulatory Visit: Payer: Self-pay | Admitting: Family Medicine

## 2023-10-12 LAB — HEMOGLOBIN A1C
Est. average glucose Bld gHb Est-mCnc: 123 mg/dL
Hgb A1c MFr Bld: 5.9 % — ABNORMAL HIGH (ref 4.8–5.6)

## 2023-10-24 ENCOUNTER — Ambulatory Visit: Payer: Self-pay | Admitting: Family Medicine

## 2023-10-26 NOTE — Progress Notes (Signed)
 Lab results have been discussed.   Verbalized understanding? Yes  Are there any questions? No

## 2024-04-08 ENCOUNTER — Encounter: Admitting: Family Medicine

## 2024-04-22 ENCOUNTER — Encounter: Admitting: Family Medicine
